# Patient Record
Sex: Female | Born: 1965 | Race: Black or African American | Hispanic: No | Smoking: Former smoker
Health system: Southern US, Community
[De-identification: ages and names within clinical notes are randomized; demographics above are authoritative.]

## PROBLEM LIST (undated history)

## (undated) DIAGNOSIS — I10 Essential (primary) hypertension: Secondary | ICD-10-CM

## (undated) DIAGNOSIS — E119 Type 2 diabetes mellitus without complications: Secondary | ICD-10-CM

## (undated) DIAGNOSIS — Z8719 Personal history of other diseases of the digestive system: Secondary | ICD-10-CM

## (undated) DIAGNOSIS — M7120 Synovial cyst of popliteal space [Baker], unspecified knee: Secondary | ICD-10-CM

## (undated) HISTORY — DX: Type 2 diabetes mellitus without complications: E11.9

## (undated) HISTORY — DX: Essential (primary) hypertension: I10

## (undated) HISTORY — PX: BREAST EXCISIONAL BIOPSY: SUR124

## (undated) HISTORY — PX: COLONOSCOPY: SHX174

## (undated) HISTORY — PX: BREAST BIOPSY: SHX20

---

## 1998-10-21 ENCOUNTER — Other Ambulatory Visit: Admission: RE | Admit: 1998-10-21 | Discharge: 1998-10-21 | Payer: Self-pay | Admitting: *Deleted

## 1999-09-10 ENCOUNTER — Other Ambulatory Visit: Admission: RE | Admit: 1999-09-10 | Discharge: 1999-09-10 | Payer: Self-pay | Admitting: Internal Medicine

## 1999-09-18 ENCOUNTER — Encounter: Payer: Self-pay | Admitting: Family Medicine

## 1999-09-18 ENCOUNTER — Encounter: Admission: RE | Admit: 1999-09-18 | Discharge: 1999-09-18 | Payer: Self-pay | Admitting: Family Medicine

## 2000-12-01 ENCOUNTER — Other Ambulatory Visit: Admission: RE | Admit: 2000-12-01 | Discharge: 2000-12-01 | Payer: Self-pay | Admitting: Internal Medicine

## 2001-01-19 ENCOUNTER — Other Ambulatory Visit: Admission: RE | Admit: 2001-01-19 | Discharge: 2001-01-19 | Payer: Self-pay | Admitting: Internal Medicine

## 2001-11-25 ENCOUNTER — Encounter: Payer: Self-pay | Admitting: Obstetrics and Gynecology

## 2001-11-25 ENCOUNTER — Ambulatory Visit (HOSPITAL_COMMUNITY): Admission: AD | Admit: 2001-11-25 | Discharge: 2001-11-25 | Payer: Self-pay | Admitting: Obstetrics and Gynecology

## 2001-11-25 ENCOUNTER — Encounter (INDEPENDENT_AMBULATORY_CARE_PROVIDER_SITE_OTHER): Payer: Self-pay | Admitting: *Deleted

## 2005-03-07 ENCOUNTER — Emergency Department (HOSPITAL_COMMUNITY): Admission: EM | Admit: 2005-03-07 | Discharge: 2005-03-08 | Payer: Self-pay | Admitting: Emergency Medicine

## 2006-07-17 ENCOUNTER — Emergency Department (HOSPITAL_COMMUNITY): Admission: EM | Admit: 2006-07-17 | Discharge: 2006-07-17 | Payer: Self-pay | Admitting: Emergency Medicine

## 2006-08-11 ENCOUNTER — Other Ambulatory Visit: Admission: RE | Admit: 2006-08-11 | Discharge: 2006-08-11 | Payer: Self-pay | Admitting: Obstetrics and Gynecology

## 2006-10-20 ENCOUNTER — Ambulatory Visit (HOSPITAL_COMMUNITY): Admission: RE | Admit: 2006-10-20 | Discharge: 2006-10-20 | Payer: Self-pay | Admitting: Obstetrics and Gynecology

## 2007-01-12 ENCOUNTER — Ambulatory Visit (HOSPITAL_COMMUNITY): Admission: RE | Admit: 2007-01-12 | Discharge: 2007-01-12 | Payer: Self-pay | Admitting: Obstetrics and Gynecology

## 2007-01-12 ENCOUNTER — Encounter (INDEPENDENT_AMBULATORY_CARE_PROVIDER_SITE_OTHER): Payer: Self-pay | Admitting: Specialist

## 2007-04-05 ENCOUNTER — Encounter: Admission: RE | Admit: 2007-04-05 | Discharge: 2007-04-05 | Payer: Self-pay | Admitting: Obstetrics and Gynecology

## 2007-04-09 ENCOUNTER — Encounter: Admission: RE | Admit: 2007-04-09 | Discharge: 2007-04-09 | Payer: Self-pay | Admitting: Interventional Radiology

## 2007-05-30 ENCOUNTER — Ambulatory Visit (HOSPITAL_COMMUNITY): Admission: RE | Admit: 2007-05-30 | Discharge: 2007-05-30 | Payer: Self-pay | Admitting: Interventional Radiology

## 2007-06-01 ENCOUNTER — Ambulatory Visit (HOSPITAL_COMMUNITY): Admission: RE | Admit: 2007-06-01 | Discharge: 2007-06-02 | Payer: Self-pay | Admitting: Interventional Radiology

## 2007-06-21 ENCOUNTER — Encounter: Admission: RE | Admit: 2007-06-21 | Discharge: 2007-06-21 | Payer: Self-pay | Admitting: Interventional Radiology

## 2007-10-09 ENCOUNTER — Other Ambulatory Visit: Admission: RE | Admit: 2007-10-09 | Discharge: 2007-10-09 | Payer: Self-pay | Admitting: Obstetrics and Gynecology

## 2007-10-23 ENCOUNTER — Ambulatory Visit (HOSPITAL_COMMUNITY): Admission: RE | Admit: 2007-10-23 | Discharge: 2007-10-23 | Payer: Self-pay | Admitting: Obstetrics and Gynecology

## 2007-10-27 ENCOUNTER — Encounter: Admission: RE | Admit: 2007-10-27 | Discharge: 2007-10-27 | Payer: Self-pay | Admitting: Obstetrics and Gynecology

## 2007-12-12 ENCOUNTER — Encounter: Admission: RE | Admit: 2007-12-12 | Discharge: 2007-12-12 | Payer: Self-pay | Admitting: Surgery

## 2007-12-14 ENCOUNTER — Ambulatory Visit (HOSPITAL_BASED_OUTPATIENT_CLINIC_OR_DEPARTMENT_OTHER): Admission: RE | Admit: 2007-12-14 | Discharge: 2007-12-14 | Payer: Self-pay | Admitting: Surgery

## 2007-12-14 ENCOUNTER — Encounter (INDEPENDENT_AMBULATORY_CARE_PROVIDER_SITE_OTHER): Payer: Self-pay | Admitting: Surgery

## 2007-12-14 ENCOUNTER — Encounter: Admission: RE | Admit: 2007-12-14 | Discharge: 2007-12-14 | Payer: Self-pay | Admitting: Surgery

## 2007-12-20 ENCOUNTER — Encounter: Admission: RE | Admit: 2007-12-20 | Discharge: 2007-12-20 | Payer: Self-pay | Admitting: Interventional Radiology
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
## 2011-03-30 NOTE — Op Note (Signed)
Lindsey Beasley, Lindsey Beasley            ACCOUNT NO.:  [id number]   MEDICAL RECORD NO.:  [id number]          PATIENT TYPE:  AMB   LOCATION:  DSC                          FACILITY:  [facility]   PHYSICIAN:  Thomas A. Cornett, M.D.DATE OF BIRTH:  Aug 21, 1966   DATE OF PROCEDURE:  12/14/2007  DATE OF DISCHARGE:                               OPERATIVE REPORT   PREOPERATIVE DIAGNOSIS:  Left breast mass.   POSTOPERATIVE DIAGNOSIS:  Left breast mass.   PROCEDURE:  Left breast needle localized excisional biopsy.   SURGEON:  Maisie Fus A. Cornett, M.D.   ANESTHESIA:  MAC with 0.25% Sensorcaine.   DRAINS:  None.   ESTIMATED BLOOD LOSS:  10 mL.   SPECIMEN:  Left breast tissue with localizing wire and previously placed  core biopsy clip found to be present in radiographic specimen.   INDICATIONS FOR PROCEDURE:  The patient is a 45 year old female with a  left breast mass.  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  She presents today for definitive excision.   DESCRIPTION OF PROCEDURE:  After undergoing left breast needle  localization, the patient was brought back to the operating room and  placed supine.  After induction of MAC anesthesia, the left breast was  prepped and draped in a sterile fashion.  The wire exited the medial  left breast.  An incision was made after infiltration of local  anesthesia.  The tissue around the tip of the wire was excised in its  entirety.  This was placed in the Faxitron and the image revealed the  tip of the wire to be present and the clip to be present.  This was sent  to pathology.  The cavity was irrigated out and closed in layers with a  3-0 Vicryl and a subsequent 4-0 Monocryl in a subcuticular fashion.  Dermabond was applied.  All final counts of sponge, needle and  instruments were found to be correct this portion of the case.  The  patient was awakened and  taken to recovery in satisfactory condition.      Thomas A. Cornett, M.D.  Electronically Signed    TAC/MEDQ  D:  12/14/2007  T:  12/14/2007  Job:  [id number]   cc:   Dr. Roswell Nickel

## 2011-04-02 NOTE — H&P (Signed)
Lindsey Beasley, COMUNALE            ACCOUNT NO.:  [id number]   MEDICAL RECORD NO.:  [id number]          PATIENT TYPE:  AMB   LOCATION:  SDC                           FACILITY:  [facility]   PHYSICIAN:  Gerald Leitz, MD          DATE OF BIRTH:  [birth year]   DATE OF ADMISSION:  01/12/2007  DATE OF DISCHARGE:                              HISTORY & PHYSICAL   CHIEF COMPLAINT:  Menorrhagia.   HISTORY OF PRESENT ILLNESS:  This is a 45 year old, G4, P2-0-2-2, with  history of menorrhagia and uterine fibroids.  She had an ultrasound that  showed multiple uterine fibroids, largest measuring 6.8 x 6.6 x 6.3 cm  that was posterior and submucosal  The uterus measured 12.2 cm in  length, and she has a history of anemia.  Hemoglobin in October 2007 was  8.0.  The patient is not interested in hysterectomy and desires  treatment with endometrial ablation and D&C.   PAST MEDICAL HISTORY:  Hypertension.   PAST OB HISTORY:  1. C section x2.  2. Elective abortion x1.  3. Miscarriage x1.   GYN HISTORY:  No history of sexually transmitted diseases, no history of  abnormal Pap smears.  Last Pap smear was September 2007, and this was  normal.   MEDICATIONS:  Iron sulfate and two blood pressure medications.  The  patient is unsure of these medications and will bring to her preop visit  at the hospital.   ALLERGIES:  No known drug allergies.   SOCIAL HISTORY:  The patient is an Production designer, theatre/television/film, works in [employer].  She smokes approximately 5 cigarettes a day.  Reports  alcohol with beer and wine on occasion.  No illicit drug use.   FAMILY HISTORY:  No immediate family history of breast, ovarian, colon  cancer.   REVIEW OF SYSTEMS:  Negative except as stated in History of Present  Illness.   PHYSICAL EXAMINATION:  VITAL SIGNS: Blood pressure 128/92, heart rate  86.  Weight 240 pounds.  CARDIOVASCULAR:  Regular rate and rhythm.  LUNGS: Clear to auscultation bilaterally.  ABDOMEN: Soft, nontender,  nondistended.  Positive bowel sounds.  EXTREMITIES:  No clubbing, cyanosis, or edema.  PELVIC:  Normal external female genitalia.  No vulvar, vaginal, cervical  lesions noted.  Bimanual exam revealed approximately 12-14 week size  uterus.  No adnexal masses or tenderness.   IMPRESSION AND PLAN:  Menorrhagia, most likely secondary to uterine  fibroids.  The patient declined definitive therapy with hysterectomy and  desires to proceed with D&C and endometrial ablation.  Risks, benefits,  alternatives of the surgery were discussed with the patient including  infection, bleeding, uterine perforation, need for possible further  surgery, transfusion, HIV, hepatitis B and C.  She understands all risks  and desires to proceed with dilation and curettage and endometrial  ablation.      Gerald Leitz, MD  Electronically Signed     TC/MEDQ  D:  01/10/2007  T:  01/10/2007  Job:  [id number]   cc:   Preop Holding Area

## 2011-04-02 NOTE — Op Note (Signed)
[facility]  Patient:    Lindsey Beasley, Lindsey Beasley Visit Number: [id number] MRN: [id number]          Service Type: DSU Location: [facility] Attending Physician:  Leonard Schwartz Dictated by:   Janine Limbo, M.D. Proc. Date: 11/25/01 Admit Date:  11/25/2001                             Operative Report  PREOPERATIVE DIAGNOSES:       1. First trimester incomplete abortion.                               2. Anemia (hemoglobin 11.9).  POSTOPERATIVE DIAGNOSES:      1. First trimester incomplete abortion.                               2. Anemia (hemoglobin 11.9).  PROCEDURE:                    Suction dilatation and evacuation.  SURGEON:                      Janine Limbo, M.D.  ANESTHESIA:                   Monitored anesthetic control with paracervical block using 1% lidocaine.  INDICATIONS:                  The patient is a 45 year old female, gravida 3, para 2-0-0-2, who presents with a first trimester incomplete abortion.  The patient presented to the emergency department at [facility] complaining of bleeding and cramping.  I was the on-call physician and, therefore, I was responsible for caring for the patient.  She was noted to have an elevated quantitative hCG and she was also noted to have tissue at the cervical os.  A dilatation and curettage was recommended and accepted.  The patient understands the indications for her procedure and she accepts the risks of (but not limited to) anesthetic complications, bleeding, infection and possible damage to the surrounding organs.  FINDINGS:                     The patients blood type is A positive.  A large amount of products of conception were removed from the patients cervix and uterine cavity.  The uterus sounded to 11 cm.  The preoperative uterus was approximately 12 weeks in size and the postoperative uterus was approximately 10 weeks in size.  DESCRIPTION OF PROCEDURE:     The  patient was taken to the operating room, where she was given medication through her IV line.  The perineum and vagina were prepped with multiple layers of Betadine.  The bladder was drained of urine.  Examination under anesthesia was performed.  The patient was sterilely draped.  A paracervical block was placed using 10 cc of 1% Xylocaine.  Tissue was removed from the vagina and the cervix.  The uterus was sounded to 11 cm. The uterine cavity was curetted using a size #8 suction curet and a medium sharp curet.  The cavity was felt to be clean at the end of the procedure. Sponge, needle and instrument counts were correct on two occasions.  Estimated blood loss was 30 cc, and that included the blood  clots in the vagina.  The patient was returned to the supine position and taken to the recovery room in stable condition at the end of her procedure.  FOLLOW-UP INSTRUCTIONS:       The patient was given a prescription for Tylenol #3.  She will take 1-2 tablets every four hours as needed for pain.  SHe will call for questions or concerns.  She was given a copy of the postoperative instruction sheet as prepared by the [facility] for patients who have undergone a dilatation and curettage. Dictated by:   Janine Limbo, M.D. Attending Physician:  Leonard Schwartz DD:  11/25/01 TD:  11/26/01 Job: [id number]

## 2011-04-02 NOTE — Op Note (Signed)
NAMEAQUILA, Lindsey Beasley            ACCOUNT NO.:  [id number]   MEDICAL RECORD NO.:  [id number]          PATIENT TYPE:  AMB   LOCATION:  SDC                           FACILITY:  [facility]   PHYSICIAN:  Gerald Leitz, MD          DATE OF BIRTH:  [birth year]   DATE OF PROCEDURE:  01/12/2007  DATE OF DISCHARGE:                               OPERATIVE REPORT   PREOPERATIVE DIAGNOSES:  1. Menorrhagia.  2. Uterine fibroids.   POSTOPERATIVE DIAGNOSES:  1. Menorrhagia.  2. Uterine fibroids.   PROCEDURES:  1. Dilation and curettage.  2. Attempted ThermaChoice endometrial ablation.   SURGEON:  Gerald Leitz, MD   ASSISTANT:  None.   ANESTHESIA:  General.   SPECIMEN:  Endometrial curettings sent to pathology.   ESTIMATED BLOOD LOSS:  Minimal.   FLUIDS:  Per anesthesia.   URINE OUTPUT:  150 mL of clear urine at the beginning of the procedure.   COMPLICATIONS:  None.   FINDINGS:  A 14-week sized uterus by bimanual exam.  The uterus sounded  to 10.5.   PROCEDURE:  The patient was taken to the operating room, where she was  placed in the dorsal lithotomy position.  She was prepped and draped in  the usual sterile fashion.  A bivalve speculum was placed into the  vaginal vault.  The anterior lip of the cervix was grasped with a single-  toothed tenaculum.  The uterus was then sounded to 10.5 cm.  Dilation  was performed of the cervix up to #[facility] dilator.  Sharp curettage  was performed until a gritty texture was noted all the way round.  The  ThermaChoice ablation apparatus was primed to -150 mmHg and the  ThermaChoice apparatus was inserted through the cervix to a depth of 10  cm.  It was filled with D5W 35 mL.  The pressure was not maintained to  150 mmHg and continued to fall.  A decision was made to abort attempt at  performing ThermaChoice ablation due to the large cavity size of the  uterus.  The single-tooth tenaculum was removed from the anterior lip of  the cervix.  The bivalve  speculum was removed.  Excellent hemostasis was  noted  prior to removal of the bivalve speculum.  The patient was awakened from  anesthesia and taken to the recovery room awake and in stable condition.   SPECIMEN:  Endometrial curettings.  Disposition:  Sent to pathology.      Gerald Leitz, MD  Electronically Signed     TC/MEDQ  D:  01/12/2007  T:  01/12/2007  Job:  [id number]

## 2011-06-08 ENCOUNTER — Other Ambulatory Visit: Payer: Self-pay | Admitting: Obstetrics and Gynecology

## 2011-06-08 DIAGNOSIS — Z1231 Encounter for screening mammogram for malignant neoplasm of breast: Secondary | ICD-10-CM

## 2011-06-17 ENCOUNTER — Ambulatory Visit
Admission: RE | Admit: 2011-06-17 | Discharge: 2011-06-17 | Disposition: A | Discharge status: Home / Self Care | Payer: Federal, State, Local not specified - PPO | Source: Ambulatory Visit | Attending: Obstetrics and Gynecology | Admitting: Obstetrics and Gynecology

## 2011-06-17 DIAGNOSIS — Z1231 Encounter for screening mammogram for malignant neoplasm of breast: Secondary | ICD-10-CM

## 2011-06-21 ENCOUNTER — Other Ambulatory Visit: Payer: Self-pay | Admitting: Obstetrics and Gynecology

## 2011-06-21 DIAGNOSIS — R928 Other abnormal and inconclusive findings on diagnostic imaging of breast: Secondary | ICD-10-CM

## 2011-06-25 ENCOUNTER — Ambulatory Visit
Admission: RE | Admit: 2011-06-25 | Discharge: 2011-06-25 | Disposition: A | Discharge status: Home / Self Care | Payer: Federal, State, Local not specified - PPO | Source: Ambulatory Visit | Attending: Obstetrics and Gynecology | Admitting: Obstetrics and Gynecology

## 2011-06-25 DIAGNOSIS — R928 Other abnormal and inconclusive findings on diagnostic imaging of breast: Secondary | ICD-10-CM

## 2011-08-05 LAB — POCT HEMOGLOBIN-HEMACUE: Hemoglobin: 12.5

## 2011-08-05 LAB — BASIC METABOLIC PANEL
BUN: 4 — ABNORMAL LOW
CO2: 24
Calcium: 8.7
Creatinine, Ser: 0.65
GFR calc non Af Amer: >60
Glucose, Bld: 107 — ABNORMAL HIGH

## 2011-08-05 LAB — DIFFERENTIAL
Basophils Absolute: 0.1
Basophils Relative: 1
Lymphocytes Relative: 40
Monocytes Absolute: 0.5
Neutro Abs: 3.8
Neutrophils Relative %: 46

## 2011-08-05 LAB — CBC
MCHC: 32.6
Platelets: 426 — ABNORMAL HIGH
RDW: 16.9 — ABNORMAL HIGH

## 2011-08-13 ENCOUNTER — Other Ambulatory Visit (HOSPITAL_COMMUNITY)
Admission: RE | Admit: 2011-08-13 | Discharge: 2011-08-13 | Disposition: A | Discharge status: Home / Self Care | Payer: Federal, State, Local not specified - PPO | Source: Ambulatory Visit | Attending: Obstetrics and Gynecology | Admitting: Obstetrics and Gynecology

## 2011-08-13 ENCOUNTER — Other Ambulatory Visit: Payer: Self-pay | Admitting: Obstetrics and Gynecology

## 2011-08-13 DIAGNOSIS — Z01419 Encounter for gynecological examination (general) (routine) without abnormal findings: Secondary | ICD-10-CM | POA: Insufficient documentation

## 2011-08-30 LAB — CREATININE, SERUM
Creatinine, Ser: 0.59
GFR calc Af Amer: >60
GFR calc non Af Amer: >60

## 2011-08-30 LAB — CBC
MCHC: 32.7
RBC: 3.94
WBC: 6.9

## 2011-12-08 ENCOUNTER — Other Ambulatory Visit: Payer: Self-pay | Admitting: Pharmacist

## 2011-12-08 ENCOUNTER — Other Ambulatory Visit: Payer: Self-pay | Admitting: Obstetrics and Gynecology

## 2011-12-08 DIAGNOSIS — N6009 Solitary cyst of unspecified breast: Secondary | ICD-10-CM

## 2012-01-03 ENCOUNTER — Ambulatory Visit
Admission: RE | Admit: 2012-01-03 | Discharge: 2012-01-03 | Disposition: A | Discharge status: Home / Self Care | Payer: Federal, State, Local not specified - PPO | Source: Ambulatory Visit | Attending: Obstetrics and Gynecology | Admitting: Obstetrics and Gynecology

## 2012-01-03 DIAGNOSIS — N6009 Solitary cyst of unspecified breast: Secondary | ICD-10-CM

## 2012-03-11 ENCOUNTER — Other Ambulatory Visit (HOSPITAL_COMMUNITY): Payer: Self-pay | Admitting: Pharmacy Technician

## 2012-03-11 ENCOUNTER — Emergency Department (HOSPITAL_COMMUNITY)
Admission: EM | Admit: 2012-03-11 | Discharge: 2012-03-11 | Disposition: A | Discharge status: Home / Self Care | Payer: Federal, State, Local not specified - PPO | Attending: Emergency Medicine | Admitting: Emergency Medicine

## 2012-03-11 ENCOUNTER — Emergency Department (HOSPITAL_COMMUNITY): Payer: Federal, State, Local not specified - PPO

## 2012-03-11 DIAGNOSIS — S42253A Displaced fracture of greater tuberosity of unspecified humerus, initial encounter for closed fracture: Secondary | ICD-10-CM | POA: Insufficient documentation

## 2012-03-11 DIAGNOSIS — W010XXA Fall on same level from slipping, tripping and stumbling without subsequent striking against object, initial encounter: Secondary | ICD-10-CM | POA: Insufficient documentation

## 2012-03-11 MED ORDER — ACETAMINOPHEN 325 MG PO TABS
650.0000 mg | ORAL_TABLET | Freq: Once | ORAL | Status: AC
  Administered 2012-03-11: 650 mg via ORAL
  Filled 2012-03-11: qty 2

## 2012-03-11 MED ORDER — HYDROCODONE-ACETAMINOPHEN 5-325 MG PO TABS: 1.0000 | ORAL_TABLET | ORAL | Status: AC | PRN

## 2012-03-11 NOTE — Discharge Instructions (Signed)
Take vicodin as prescribed for severe pain.   Do not drive within four hours of taking this medication (may cause drowsiness or confusion).  Keep your shoulder immobilized in sling until you have followed up with the orthopedic doctor.  Apply ice to painful areas 2-3 times a day for 15-20 minutes.  You may return to the ER if symptoms worsen or you have any other concerns.

## 2012-03-11 NOTE — ED Notes (Signed)
Pt was reaching up to put something on a shelf and jumped and lost her balance when she landed and fell on her left side. Pt now with left shoulder pain.  No swelling or deformity noted.

## 2012-03-11 NOTE — ED Provider Notes (Signed)
History     CSN: [id number]  Arrival date & time 03/11/12  1755   First MD Initiated Contact with Patient 03/11/12 1900      Chief Complaint  Patient presents with  . Fall  . Shoulder Pain    (Consider location/radiation/quality/duration/timing/severity/associated sxs/prior treatment) HPI History provided by pt.   Pt was reaching up to place something on a shelf in her garage, lost her footing, and fell landing on her left shoulder first, and then hitting the rest of her left side.  Injury occurred just PTA.  Denies having lightheadedness, dizziness, CP/SOB/palpitations prior to fall.  Unsure of whether or not she hit her head, but no LOC and denies headache, dizziness, blurred vision, N/V.  C/o severe, constant pain left shoulder that is aggravated by ROM, and milder pain left elbow and entire LLE.  Has not taken anything for pain.  No associated paresthesias.  She is able to ambulate w/out difficulty.  Denies neck/back pain.    No past medical history on file.  No past surgical history on file.  No family history on file.  History  Substance Use Topics  . Smoking status: Not on file  . Smokeless tobacco: Not on file  . Alcohol Use: Not on file    OB History    No data available      Review of Systems  All other systems reviewed and are negative.    Allergies  Review of patient's allergies indicates no known allergies.  Home Medications   Current Outpatient Rx  Name Route Sig Dispense Refill  . ACETAMINOPHEN 500 MG PO TABS Oral Take 500 mg by mouth every 6 (six) hours as needed. For pain relief    . AMLODIPINE BESYLATE 10 MG PO TABS Oral Take 10 mg by mouth daily.    Marland Kitchen LOSARTAN POTASSIUM-HCTZ 100-25 MG PO TABS Oral Take 1 tablet by mouth daily.    . NEBIVOLOL HCL 2.5 MG PO TABS Oral Take 2.5 mg by mouth daily.      BP 139/86  Pulse 80  Temp(Src) 98.9 F (37.2 C) (Oral)  Resp 20  SpO2 99%  Physical Exam  Nursing note and vitals reviewed. Constitutional:  She is oriented to person, place, and time. She appears well-developed and well-nourished. No distress.  HENT:  Head: Normocephalic and atraumatic.  Eyes:       Normal appearance  Neck: Normal range of motion.  Pulmonary/Chest: Effort normal.  Musculoskeletal: Normal range of motion.       Entire spine non-tender.  No extremity deformities.  Mild erythema anterior and top of left shoulder.  Tenderness at left [facility] joint, proximal humerus and less so at scapula.  Pain w/ passive abduction/flexion greater than 90deg.  No tenderness upper arm or forearm.  Mild tenderness olecranon.  Passive ROM of elbow induces pain in shoulder.  Full ROM of wrist but supination induces pain at shoulder.  2+ radial pulse and distal sensation intact.  Pelvis stable.  Left hip, knee and ankle w/ full, non-painful ROM.  2+ DP pulse and distal sensation intact.   Neurological: She is alert and oriented to person, place, and time.       CN 3-12 intact.  5/5 and equal upper and lower extremity strength.  No past pointing.     Psychiatric: She has a normal mood and affect. Her behavior is normal.    ED Course  Procedures (including critical care time)  Labs Reviewed - No data to display Dg Elbow  Complete Left  03/11/2012  *RADIOLOGY REPORT*  Clinical Data: Fall  LEFT ELBOW - COMPLETE 3+ VIEW  Comparison: None.  Findings: No acute fracture and no dislocation.  Unremarkable soft tissues.  IMPRESSION: No acute bony pathology.  Original Report Authenticated By: Donavan Burnet, M.D.   Dg Shoulder Left  03/11/2012  *RADIOLOGY REPORT*  Clinical Data: Fall  LEFT SHOULDER - 2+ VIEW  Comparison: None.  Findings: There is a minimally displaced a avulsion fracture involving the greater tuberosity of the proximal humerus.  The humeral neck is intact.  Glenoid and clavicle are intact.  Acromion is intact.  IMPRESSION: Minimally displaced small avulsion fracture from the greater tuberosity of the proximal humerus.  Original Report  Authenticated By: Donavan Burnet, M.D.     1. Fracture of humerus greater tuberosity       MDM  Pt had a mechanical fall this evening and fell, landing on her left side.  Unsure of whether or not she hit her head but is not anti-coagulated, no signs of head trauma, no neuro complaints nor focal neuro deficits on exam.  C/o pain in left shoulder and elbow.  Xrays sig for sm avulsion fx from greater tuberosity of proximal humerus.  Results discussed w/ pt.  Ortho tech provided w/ shoulder sling and pt d/c'd home w/ vicodin and referral to ortho.        Arie Sabina [place], [place] 03/12/12 [id number]

## 2012-03-11 NOTE — ED Notes (Signed)
Pt is awaiting sling/immobilizer placement on left arm

## 2012-03-18 NOTE — ED Provider Notes (Signed)
Medical screening examination/treatment/procedure(s) were performed by non-physician practitioner and as supervising physician I was immediately available for consultation/collaboration.  Cherese Lozano, MD 03/18/12 0818 

## 2012-06-28 ENCOUNTER — Other Ambulatory Visit: Payer: Self-pay | Admitting: Obstetrics and Gynecology

## 2012-06-28 DIAGNOSIS — Z1231 Encounter for screening mammogram for malignant neoplasm of breast: Secondary | ICD-10-CM

## 2012-07-14 ENCOUNTER — Ambulatory Visit
Admission: RE | Admit: 2012-07-14 | Discharge: 2012-07-14 | Disposition: A | Discharge status: Home / Self Care | Payer: Federal, State, Local not specified - PPO | Source: Ambulatory Visit | Attending: Obstetrics and Gynecology | Admitting: Obstetrics and Gynecology

## 2012-07-14 DIAGNOSIS — Z1231 Encounter for screening mammogram for malignant neoplasm of breast: Secondary | ICD-10-CM

## 2012-08-17 ENCOUNTER — Other Ambulatory Visit: Payer: Self-pay | Admitting: Obstetrics and Gynecology

## 2012-08-17 ENCOUNTER — Other Ambulatory Visit (HOSPITAL_COMMUNITY)
Admission: RE | Admit: 2012-08-17 | Discharge: 2012-08-17 | Disposition: A | Discharge status: Home / Self Care | Payer: Federal, State, Local not specified - PPO | Source: Ambulatory Visit | Attending: Obstetrics and Gynecology | Admitting: Obstetrics and Gynecology

## 2012-08-17 DIAGNOSIS — Z01419 Encounter for gynecological examination (general) (routine) without abnormal findings: Secondary | ICD-10-CM | POA: Insufficient documentation

## 2013-07-02 ENCOUNTER — Other Ambulatory Visit: Payer: Self-pay

## 2013-07-02 DIAGNOSIS — Z1231 Encounter for screening mammogram for malignant neoplasm of breast: Secondary | ICD-10-CM

## 2013-07-18 ENCOUNTER — Ambulatory Visit
Admission: RE | Admit: 2013-07-18 | Discharge: 2013-07-18 | Disposition: A | Discharge status: Home / Self Care | Payer: Federal, State, Local not specified - PPO | Source: Ambulatory Visit

## 2013-07-18 DIAGNOSIS — Z1231 Encounter for screening mammogram for malignant neoplasm of breast: Secondary | ICD-10-CM

## 2013-09-05 ENCOUNTER — Other Ambulatory Visit (HOSPITAL_COMMUNITY)
Admission: RE | Admit: 2013-09-05 | Discharge: 2013-09-05 | Disposition: A | Discharge status: Home / Self Care | Payer: Federal, State, Local not specified - PPO | Source: Ambulatory Visit | Attending: Obstetrics and Gynecology | Admitting: Obstetrics and Gynecology

## 2013-09-05 ENCOUNTER — Other Ambulatory Visit: Payer: Self-pay | Admitting: Obstetrics and Gynecology

## 2013-09-05 DIAGNOSIS — Z1151 Encounter for screening for human papillomavirus (HPV): Secondary | ICD-10-CM | POA: Insufficient documentation

## 2013-09-05 DIAGNOSIS — Z01419 Encounter for gynecological examination (general) (routine) without abnormal findings: Secondary | ICD-10-CM | POA: Insufficient documentation

## 2013-09-05 DIAGNOSIS — Z113 Encounter for screening for infections with a predominantly sexual mode of transmission: Secondary | ICD-10-CM | POA: Insufficient documentation

## 2013-09-17 ENCOUNTER — Other Ambulatory Visit: Payer: Self-pay | Admitting: Gastroenterology

## 2014-01-18 ENCOUNTER — Ambulatory Visit: Payer: Self-pay | Admitting: Podiatrist

## 2014-01-23 ENCOUNTER — Encounter: Payer: Self-pay | Admitting: Podiatrist

## 2014-01-23 ENCOUNTER — Ambulatory Visit (INDEPENDENT_AMBULATORY_CARE_PROVIDER_SITE_OTHER): Payer: Federal, State, Local not specified - PPO | Admitting: Podiatrist

## 2014-01-23 ENCOUNTER — Ambulatory Visit (INDEPENDENT_AMBULATORY_CARE_PROVIDER_SITE_OTHER): Payer: Federal, State, Local not specified - PPO

## 2014-01-23 VITALS — BP 149/83 | HR 80 | Resp 16 | Ht 66.0 in | Wt 200.0 lb

## 2014-01-23 DIAGNOSIS — M898X9 Other specified disorders of bone, unspecified site: Secondary | ICD-10-CM

## 2014-01-23 NOTE — Progress Notes (Deleted)
Curly 5th toes, small baby bunion left-- Talked surgery-- has done all conservative

## 2014-01-23 NOTE — Progress Notes (Signed)
   Subjective:    Patient ID: Lindsey Beasley, female    DOB: 06-Jul-1966, 48 y.o.   MRN: [id number]  HPI Comments: "I have these corns"  Patient c/o painful 5th toes bilateral, left worse than right, for several months. The areas are callused and red, especially the left. Her shoes aggravate. She keeps areas padded with shoe gear.      Review of Systems  HENT: Positive for sinus pressure.   Respiratory: Positive for cough.   Allergic/Immunologic: Positive for environmental allergies.  All other systems reviewed and are negative.       Objective:   Physical Exam  GENERAL APPEARANCE: Alert, conversant. Appropriately groomed. No acute distress.  VASCULAR: Pedal pulses palpable at 2/4 DP and PT bilateral.  Capillary refill time is immediate to all digits,  Proximal to distal cooling it warm to warm.  Digital hair growth is present bilateral  NEUROLOGIC: sensation is intact epicritically and protectively to 5.07 monofilament at 5/5 sites bilateral.  Light touch is intact bilateral, vibratory sensation intact bilateral, achilles tendon reflex is intact bilateral.  MUSCULOSKELETAL: acceptable muscle strength, tone and stability bilateral. Adductovarus deformity bilateral fifth digits is present. Hyperkeratotic lesions are present at the proximal interphalangeal joint bilaterally. A small bunion deformity is also present on the left. DERMATOLOGIC: Hyperkeratotic lesion/corns are present at the head of the proximal phalanx on the fifth digits bilaterally. Otherwise the remainder of the skin color, texture, and turger are within normal limits.      Assessment & Plan:  Adductovarus deformity fifth digits bilaterally, associated corns bilaterally  Plan: Discussed conservative treatment options including shoe gear changes, padding, strapping and the patient feels she's exhausted all conservative care. We also discussed surgery which would include derotational arthroplasty with excision of  hypertrophic bone fifth digits bilaterally. Possible shave of the bunion left will be addressed if this is bothersome as well. If she is interested in surgery she will call and let me know we will schedule a preoperative consult.

## 2014-01-23 NOTE — Patient Instructions (Signed)
Curled Fifth Toe A curled fifth toe is an inherited condition in which your little toe curls under the toe next to it and the toenail is pointing outward. In this condition, your little toe bears weight on its side with resulting discomfort and cornsor calluses (a buildup of dead skin cells over bones that come in frequent contact with hard surfaces). These problems, if painful or troublesome, can be corrected with surgery. This is an elective surgery, (you can decide whether or not you should undergo the procedure). The surgery may be cosmetic (improve appearance), relieve pain or improve function. If surgery is recommended, your caregiver will explain your foot problem and how surgery can improve it. Your caregiver can answer your questions about the potential risks and complications of surgery. After determining that foot surgery is the best way to correct the problem, plans for the surgery can proceed. Let the caregiver know about health changes prior to surgery. It is best to do elective surgeries when your health is optimal. Be sure to ask your caregiver if there will be limits on walking, work or school. Plan accordingly. You can bear weight immediately but may need to wear a surgical shoe for a few weeks as the toe heals. The treatment of this condition is surgical and is called a derotation arthroplasty. A wedge of skin and bone is removed to uncurl ("de-rotate") the toe and have it point forward like a normal toe. LET YOUR CAREGIVER KNOW ABOUT:   Allergies to food or medicine.  Medicines taken, including vitamins, herbs, eye drops, over-the-counter medicines and creams.  Use of steroids (by mouth or creams).  Previous problems with anesthetics or numbing medicines.  A history of bleeding problems or blood clots.  Any previous surgery.  Other important health problems, including diabetes and kidney problems.  The possibility of pregnancy, if this applies. BEFORE THE PROCEDURE You  should be present 60 minutes prior to your procedure or as directed. HOME CARE INSTRUCTIONS   You can bear weight immediately, but will need to wear a bandage, a splint and sometimes, a surgical shoe for a short time following surgery.  Only take over-the-counter or prescription medicines for pain, discomfort or fever as directed by your caregiver.  Elevate your foot above the level of the heart as much as possible to limit swelling. SEEK MEDICAL CARE IF:   You have increased bleeding (more than a small spot) from the wound.  You have redness, swelling, or increasing pain in the wound.  You have pus coming from the wound.  You notice a bad smell coming from the wound or dressing. SEEK IMMEDIATE MEDICAL CARE IF:   You develop a rash.  You have difficulty breathing.  You have any allergic problems.  You have a fever. Document Released: 10/29/2000 Document Revised: 01/24/2012 Document Reviewed: 08/27/2008 ExitCare Patient Information 2014 [employer].  

## 2014-07-03 ENCOUNTER — Other Ambulatory Visit: Payer: Self-pay

## 2014-07-03 DIAGNOSIS — Z1231 Encounter for screening mammogram for malignant neoplasm of breast: Secondary | ICD-10-CM

## 2014-07-19 ENCOUNTER — Ambulatory Visit
Admission: RE | Admit: 2014-07-19 | Discharge: 2014-07-19 | Disposition: A | Discharge status: Home / Self Care | Payer: Federal, State, Local not specified - PPO | Source: Ambulatory Visit

## 2014-07-19 ENCOUNTER — Ambulatory Visit: Payer: Federal, State, Local not specified - PPO

## 2014-07-19 DIAGNOSIS — Z1231 Encounter for screening mammogram for malignant neoplasm of breast: Secondary | ICD-10-CM

## 2014-10-03 ENCOUNTER — Other Ambulatory Visit: Payer: Self-pay | Admitting: Obstetrics and Gynecology

## 2014-10-03 ENCOUNTER — Other Ambulatory Visit (HOSPITAL_COMMUNITY)
Admission: RE | Admit: 2014-10-03 | Discharge: 2014-10-03 | Disposition: A | Discharge status: Home / Self Care | Payer: Federal, State, Local not specified - PPO | Source: Ambulatory Visit | Attending: Obstetrics and Gynecology | Admitting: Obstetrics and Gynecology

## 2014-10-03 DIAGNOSIS — Z01419 Encounter for gynecological examination (general) (routine) without abnormal findings: Secondary | ICD-10-CM | POA: Diagnosis present

## 2014-10-08 LAB — CYTOLOGY - PAP

## 2014-10-22 ENCOUNTER — Other Ambulatory Visit: Payer: Self-pay | Admitting: Obstetrics and Gynecology

## 2015-07-24 ENCOUNTER — Other Ambulatory Visit: Payer: Self-pay

## 2015-07-24 DIAGNOSIS — Z1231 Encounter for screening mammogram for malignant neoplasm of breast: Secondary | ICD-10-CM

## 2015-08-05 ENCOUNTER — Ambulatory Visit: Payer: Federal, State, Local not specified - PPO

## 2015-08-06 ENCOUNTER — Ambulatory Visit
Admission: RE | Admit: 2015-08-06 | Discharge: 2015-08-06 | Disposition: A | Discharge status: Home / Self Care | Payer: Federal, State, Local not specified - PPO | Source: Ambulatory Visit

## 2015-08-06 DIAGNOSIS — Z1231 Encounter for screening mammogram for malignant neoplasm of breast: Secondary | ICD-10-CM

## 2015-10-28 ENCOUNTER — Other Ambulatory Visit: Payer: Self-pay | Admitting: Obstetrics and Gynecology

## 2015-10-28 ENCOUNTER — Other Ambulatory Visit (HOSPITAL_COMMUNITY)
Admission: RE | Admit: 2015-10-28 | Discharge: 2015-10-28 | Disposition: A | Discharge status: Home / Self Care | Payer: Federal, State, Local not specified - PPO | Source: Ambulatory Visit | Attending: Obstetrics and Gynecology | Admitting: Obstetrics and Gynecology

## 2015-10-28 DIAGNOSIS — Z01419 Encounter for gynecological examination (general) (routine) without abnormal findings: Secondary | ICD-10-CM | POA: Insufficient documentation

## 2015-10-29 LAB — CYTOLOGY - PAP

## 2016-07-27 DIAGNOSIS — K080 Exfoliation of teeth due to systemic causes: Secondary | ICD-10-CM | POA: Diagnosis not present

## 2016-08-04 ENCOUNTER — Other Ambulatory Visit: Payer: Self-pay | Admitting: Obstetrics and Gynecology

## 2016-08-04 DIAGNOSIS — Z1231 Encounter for screening mammogram for malignant neoplasm of breast: Secondary | ICD-10-CM

## 2016-08-16 ENCOUNTER — Ambulatory Visit
Admission: RE | Admit: 2016-08-16 | Discharge: 2016-08-16 | Disposition: A | Discharge status: Home / Self Care | Payer: Federal, State, Local not specified - PPO | Source: Ambulatory Visit | Attending: Obstetrics and Gynecology | Admitting: Obstetrics and Gynecology

## 2016-08-16 DIAGNOSIS — Z1231 Encounter for screening mammogram for malignant neoplasm of breast: Secondary | ICD-10-CM

## 2016-10-28 ENCOUNTER — Other Ambulatory Visit: Payer: Self-pay | Admitting: Obstetrics and Gynecology

## 2016-10-28 ENCOUNTER — Other Ambulatory Visit (HOSPITAL_COMMUNITY)
Admission: RE | Admit: 2016-10-28 | Discharge: 2016-10-28 | Disposition: A | Discharge status: Home / Self Care | Payer: Federal, State, Local not specified - PPO | Source: Ambulatory Visit | Attending: Obstetrics and Gynecology | Admitting: Obstetrics and Gynecology

## 2016-10-28 DIAGNOSIS — Z01419 Encounter for gynecological examination (general) (routine) without abnormal findings: Secondary | ICD-10-CM | POA: Diagnosis not present

## 2016-10-28 DIAGNOSIS — Z1151 Encounter for screening for human papillomavirus (HPV): Secondary | ICD-10-CM | POA: Insufficient documentation

## 2016-10-29 ENCOUNTER — Other Ambulatory Visit: Payer: Self-pay | Admitting: Obstetrics and Gynecology

## 2016-10-29 DIAGNOSIS — N6310 Unspecified lump in the right breast, unspecified quadrant: Principal | ICD-10-CM

## 2016-10-29 DIAGNOSIS — N6315 Unspecified lump in the right breast, overlapping quadrants: Secondary | ICD-10-CM

## 2016-11-01 LAB — CYTOLOGY - PAP
DIAGNOSIS: NEGATIVE
HPV: NOT DETECTED

## 2016-11-03 ENCOUNTER — Ambulatory Visit
Admission: RE | Admit: 2016-11-03 | Discharge: 2016-11-03 | Disposition: A | Discharge status: Home / Self Care | Payer: Federal, State, Local not specified - PPO | Source: Ambulatory Visit | Attending: Obstetrics and Gynecology | Admitting: Obstetrics and Gynecology

## 2016-11-03 ENCOUNTER — Other Ambulatory Visit: Payer: Federal, State, Local not specified - PPO

## 2016-11-03 DIAGNOSIS — N6315 Unspecified lump in the right breast, overlapping quadrants: Secondary | ICD-10-CM

## 2016-11-03 DIAGNOSIS — N6310 Unspecified lump in the right breast, unspecified quadrant: Principal | ICD-10-CM

## 2016-11-03 DIAGNOSIS — R928 Other abnormal and inconclusive findings on diagnostic imaging of breast: Secondary | ICD-10-CM | POA: Diagnosis not present

## 2016-11-26 DIAGNOSIS — E785 Hyperlipidemia, unspecified: Secondary | ICD-10-CM | POA: Diagnosis not present

## 2016-11-26 DIAGNOSIS — Z0000 Encounter for general adult medical examination without abnormal findings: Secondary | ICD-10-CM | POA: Diagnosis not present

## 2016-11-26 DIAGNOSIS — R7303 Prediabetes: Secondary | ICD-10-CM | POA: Diagnosis not present

## 2016-11-26 DIAGNOSIS — I10 Essential (primary) hypertension: Secondary | ICD-10-CM | POA: Diagnosis not present

## 2016-12-28 ENCOUNTER — Encounter: Payer: Self-pay | Admitting: Dietician

## 2016-12-28 ENCOUNTER — Encounter: Payer: Federal, State, Local not specified - PPO | Attending: Family Medicine | Admitting: Dietician

## 2016-12-28 DIAGNOSIS — E119 Type 2 diabetes mellitus without complications: Secondary | ICD-10-CM | POA: Diagnosis not present

## 2016-12-28 DIAGNOSIS — Z713 Dietary counseling and surveillance: Secondary | ICD-10-CM | POA: Diagnosis not present

## 2016-12-28 NOTE — Progress Notes (Signed)
Patient was seen on 12/28/16 for the first of a series of three diabetes self-management courses at [facility].  Patient Education Plan per assessed needs and concerns is to attend four course education program for Diabetes Self Management Education.  The following learning objectives were met by the patient during this class:  Describe diabetes  State some common risk factors for diabetes  Defines the role of glucose and insulin  Identifies type of diabetes and pathophysiology  Describe the relationship between diabetes and cardiovascular risk  State the members of the Healthcare Team  States the rationale for glucose monitoring  State when to test glucose  State their individual Target Range  State the importance of logging glucose readings  Describe how to interpret glucose readings  Identifies A1C target  Explain the correlation between A1c and eAG values  State symptoms and treatment of high blood glucose  State symptoms and treatment of low blood glucose  Explain proper technique for glucose testing  Identifies proper sharps disposal  Handouts given during class include:  Living Well with Diabetes book  Carb Counting and Meal Planning book  Meal Plan Card  Carbohydrate guide  Meal planning worksheet  Low Sodium Flavoring Tips  The diabetes portion plate  Y4M to eAG Conversion Chart  Diabetes Medications  Diabetes Recommended Care Schedule  Support Group  Diabetes Success Plan  Core Class Satisfaction Survey  Follow-Up Plan:  Attend core 2

## 2017-01-04 ENCOUNTER — Ambulatory Visit: Payer: Federal, State, Local not specified - PPO

## 2017-01-11 ENCOUNTER — Ambulatory Visit: Payer: Federal, State, Local not specified - PPO

## 2017-01-13 ENCOUNTER — Encounter: Payer: Federal, State, Local not specified - PPO | Attending: Family Medicine | Admitting: Dietician

## 2017-01-13 DIAGNOSIS — Z713 Dietary counseling and surveillance: Secondary | ICD-10-CM | POA: Diagnosis not present

## 2017-01-13 DIAGNOSIS — E119 Type 2 diabetes mellitus without complications: Secondary | ICD-10-CM | POA: Insufficient documentation

## 2017-01-13 DIAGNOSIS — E118 Type 2 diabetes mellitus with unspecified complications: Secondary | ICD-10-CM

## 2017-01-13 NOTE — Progress Notes (Signed)

## 2017-01-20 ENCOUNTER — Encounter: Payer: Federal, State, Local not specified - PPO | Admitting: Dietician

## 2017-01-20 DIAGNOSIS — E119 Type 2 diabetes mellitus without complications: Secondary | ICD-10-CM | POA: Diagnosis not present

## 2017-01-20 DIAGNOSIS — E118 Type 2 diabetes mellitus with unspecified complications: Secondary | ICD-10-CM

## 2017-01-20 DIAGNOSIS — Z713 Dietary counseling and surveillance: Secondary | ICD-10-CM | POA: Diagnosis not present

## 2017-01-20 NOTE — Progress Notes (Signed)
Patient was seen on 01/20/17 for the third of a series of three diabetes self-management courses at the [facility].   Lindsey Beasley. State the amount of activity recommended for healthy living . Describe activities suitable for individual needs . Identify ways to regularly incorporate activity into daily life . Identify barriers to activity and ways to over come these barriers  Identify diabetes medications being personally used and their primary action for lowering glucose and possible side effects . Describe role of stress on blood glucose and develop strategies to address psychosocial issues . Identify diabetes complications and ways to prevent them  Explain how to manage diabetes during illness . Evaluate success in meeting personal goal . Establish 2-3 goals that they will plan to diligently work on until they return for the  5381-month follow-up visit  Goals:   I will be active 30 minutes or more 5 times a week  I will take group exercise classes to keep moving and losing weight  I will eat less unhealthy fats by eating less potato chips and pork rinds  I will reduce my alcohol intake  Your patient has identified these potential barriers to change:  None - this is a lifestyle change I need to make  Your patient has identified their diabetes self-care support plan as  [facility] Support [facility] Apps for carb counting Plan:  Attend Support Group as desired

## 2017-01-27 DIAGNOSIS — K080 Exfoliation of teeth due to systemic causes: Secondary | ICD-10-CM | POA: Diagnosis not present

## 2017-02-23 ENCOUNTER — Ambulatory Visit (INDEPENDENT_AMBULATORY_CARE_PROVIDER_SITE_OTHER): Payer: Federal, State, Local not specified - PPO | Admitting: Allergy and Immunology

## 2017-02-23 ENCOUNTER — Encounter: Payer: Self-pay | Admitting: Allergy and Immunology

## 2017-02-23 VITALS — BP 162/88 | HR 68 | Temp 98.4°F | Resp 18 | Ht 64.37 in | Wt 248.6 lb

## 2017-02-23 DIAGNOSIS — J3089 Other allergic rhinitis: Secondary | ICD-10-CM

## 2017-02-23 DIAGNOSIS — T6391XA Toxic effect of contact with unspecified venomous animal, accidental (unintentional), initial encounter: Secondary | ICD-10-CM | POA: Diagnosis not present

## 2017-02-23 DIAGNOSIS — E119 Type 2 diabetes mellitus without complications: Secondary | ICD-10-CM | POA: Diagnosis not present

## 2017-02-23 MED ORDER — MONTELUKAST SODIUM 10 MG PO TABS: 10.0000 mg | ORAL_TABLET | Freq: Every day | ORAL | 5 refills | Status: DC

## 2017-02-23 MED ORDER — OLOPATADINE HCL 0.7 % OP SOLN: 1.0000 [drp] | Freq: Every day | OPHTHALMIC | 5 refills | Status: DC | PRN

## 2017-02-23 NOTE — Progress Notes (Signed)
Dear Dr. Hyacinth Meeker,  Thank you for referring Lindsey Beasley to the [facility] [place] on 02/23/2017.   Below is a summation of this patient's evaluation and recommendations.  Thank you for your referral. I will keep you informed about this patient's response to treatment.   If you have any questions please do not hesitate to contact me.   Sincerely,  Jessica Priest, MD Allergy / Immunology [facility]   ______________________________________________________________________    NEW PATIENT NOTE  Referring Provider: Sigmund Hazel, MD Primary Provider: Neldon Labella, MD Date of office visit: 02/23/2017    Subjective:   Chief Complaint:  Lindsey Beasley (DOB: Apr 15, 1966) is a 51 y.o. female who presents to the clinic on 02/23/2017 with a chief complaint of Allergic Rhinitis  .     HPI: Lindsey Beasley presents to this clinic in evaluation of persistent allergy problems that have been present for several years initially assuming a seasonal pattern but presently assuming a perennial pattern. She will develop nasal congestion and sneezing and nose blowing and itchy red watery eyes on a perennial basis with springtime exacerbation usually following exposure to the outdoors or change in weather and possibly precipitated by exposure to her work environment. She does not have any associated anosmia or ugly nasal discharge or other types of a stent topics symptoms. She has tried various antihistamines which do help somewhat as does the use of nasal saline. She is not sure that nasal steroids in the form of Flonase has really helped her very much.  In addition, she had a history of venom allergy. At the age of 89 she was stung by some type of flying insects and developed significant throat constriction and body swelling and required emergency room evaluation. In 2002 she was stung on the back by some flying insect  and developed "numbness" and  "dizziness" and was evaluated in the emergency room and given a shot. At that point she was assigned an EpiPen.  Past Medical History:  Diagnosis Date  . Diabetes mellitus without complication (HCC)   . Hypertension     Past Surgical History:  Procedure Laterality Date  . CESAREAN SECTION  1985, 1989    Allergies as of 02/23/2017      Reactions   Lisinopril Cough   Other    Bee stings   Penicillins       Medication List      amLODipine 10 MG tablet Commonly known as:  NORVASC Take 10 mg by mouth daily.   Fish Oil 1000 MG Caps Take 1,000 mg by mouth.   hydrochlorothiazide 12.5 MG capsule Commonly known as:  MICROZIDE Take 10 mg by mouth daily.   multivitamin with minerals Tabs tablet Take 1 tablet by mouth daily.   nebivolol 2.5 MG tablet Commonly known as:  BYSTOLIC Take 2.5 mg by mouth daily.       Review of systems negative except as noted in HPI / PMHx or noted below:  Review of Systems  Constitutional: Negative.   HENT: Negative.   Eyes: Negative.   Respiratory: Negative.   Cardiovascular: Negative.   Gastrointestinal: Negative.   Genitourinary: Negative.   Musculoskeletal: Negative.   Skin: Negative.   Neurological: Negative.   Endo/Heme/Allergies: Negative.   Psychiatric/Behavioral: Negative.     Family History  Problem Relation Age of Onset  . Diabetes Mother   . Kidney failure Mother   . High blood pressure  Mother   . Parkinson's disease Father     Social History   Social History  . Marital status: Single    Spouse name: N/A  . Number of children: N/A  . Years of education: N/A   Occupational History  . Not on file.   Social History Main Topics  . Smoking status: Former Smoker    Packs/day: 0.00    Types: Cigarettes    Quit date: 2017  . Smokeless tobacco: Never Used  . Alcohol use Yes  . Drug use: No  . Sexual activity: Not on file   Other Topics Concern  . Not on file   Social History  Narrative  . No narrative on file    Environmental and Social history  Lives in a townhouse with a dry environment, no animals located inside the household, carpeting in the bedroom, no plastic on the bed or pillow, no tobacco smoke exposure in the house, and employment in an office setting.  Objective:   Vitals:   02/23/17 0855  BP: (!) 162/88  Pulse: 68  Resp: 18  Temp: 98.4 F (36.9 C)   Height: 5' 4.37" (163.5 cm) Weight: 248 lb 9.6 oz (112.8 kg)  Physical Exam  Constitutional: She is well-developed, well-nourished, and in no distress.  HENT:  Head: Normocephalic. Head is without right periorbital erythema and without left periorbital erythema.  Right Ear: Tympanic membrane, external ear and ear canal normal.  Left Ear: Tympanic membrane, external ear and ear canal normal.  Nose: Mucosal edema present. No rhinorrhea.  Mouth/Throat: Oropharynx is clear and moist and mucous membranes are normal. No oropharyngeal exudate.  Eyes: Conjunctivae and lids are normal. Pupils are equal, round, and reactive to light.  Neck: Trachea normal. No tracheal deviation present. No thyromegaly present.  Cardiovascular: Normal rate, regular rhythm, S1 normal, S2 normal and normal heart sounds.   No murmur heard. Pulmonary/Chest: Effort normal. No stridor. No tachypnea. No respiratory distress. She has no wheezes. She has no rales. She exhibits no tenderness.  Abdominal: Soft. She exhibits no distension and no mass. There is no hepatosplenomegaly. There is no tenderness. There is no rebound and no guarding.  Musculoskeletal: She exhibits no edema or tenderness.  Lymphadenopathy:       Head (right side): No tonsillar adenopathy present.       Head (left side): No tonsillar adenopathy present.    She has no cervical adenopathy.    She has no axillary adenopathy.  Neurological: She is alert. Gait normal.  Skin: No rash noted. She is not diaphoretic. No erythema. No pallor. Nails show no  clubbing.  Psychiatric: Mood and affect normal.    Diagnostics: Allergy skin tests were performed. She demonstrated hypersensitivity to grasses, trees, and house dust mite.  Assessment and Plan:    1. Other allergic rhinitis   2. Toxic effect of venom, accidental or unintentional, initial encounter     1.  Allergen avoidance measures  2. Treat and prevent inflammation:   A. OTC Rhinocort/Nasacort one spray each nostril 3-7 times a week  B. montelukast 10 mg tablet 1 time per day  3. If needed:   A. nasal saline  B. OTC antihistamine  C. Pazeo one drop each eye one time per day  D. EpiPen, Benadryl, M.D./ER evaluation for allergic reaction  4. Venom clinic evaluation without any antihistamine  5. Return to clinic in summer 2018 or earlier if problem  6. Beta blocker use?  Aquanetta Will utilize a combination of  allergen avoidance measures and anti-inflammatory agents to see if we can get her atopic respiratory disease under better control. We should know within a few weeks whether or not this form of therapy will be adequate in preventing her from developing significant problems. If she fails medical therapy she would be a candidate for immunotherapy. As well, she needs to be evaluated in venom clinic to see if she is a candidate for immunotherapy directed against Hymenoptera species. She is on a beta blocker and there is a relative risk of utilizing a beta blocker in the setting of Hymenoptera allergy for her injectable epinephrine device may not work as well with a beta blocker on board and I had a talk with her today about this issue and she will address this issue with her family doctor. I will see her back in this clinic in the summer of 2018 or earlier if there is a problem.  Jessica Priest, MD [facility] [place]

## 2017-02-23 NOTE — Patient Instructions (Addendum)
  1.  Allergen avoidance measures  2. Treat and prevent inflammation:   A. OTC Rhinocort/Nasacort one spray each nostril 3-7 times a week  B. montelukast 10 mg tablet 1 time per day  3. If needed:   A. nasal saline  B. OTC antihistamine  C. Pazeo one drop each eye one time per day  D. EpiPen, Benadryl, M.D./ER evaluation for allergic reaction  4. Venom clinic evaluation without any antihistamine  5. Return to clinic in summer 2018 or earlier if problem  6. Beta blocker use?

## 2017-05-31 DIAGNOSIS — E781 Pure hyperglyceridemia: Secondary | ICD-10-CM | POA: Diagnosis not present

## 2017-05-31 DIAGNOSIS — R7989 Other specified abnormal findings of blood chemistry: Secondary | ICD-10-CM | POA: Diagnosis not present

## 2017-05-31 DIAGNOSIS — E119 Type 2 diabetes mellitus without complications: Secondary | ICD-10-CM | POA: Diagnosis not present

## 2017-05-31 DIAGNOSIS — E785 Hyperlipidemia, unspecified: Secondary | ICD-10-CM | POA: Diagnosis not present

## 2017-06-03 DIAGNOSIS — I10 Essential (primary) hypertension: Secondary | ICD-10-CM | POA: Diagnosis not present

## 2017-06-03 DIAGNOSIS — E781 Pure hyperglyceridemia: Secondary | ICD-10-CM | POA: Diagnosis not present

## 2017-06-03 DIAGNOSIS — R748 Abnormal levels of other serum enzymes: Secondary | ICD-10-CM | POA: Diagnosis not present

## 2017-06-03 DIAGNOSIS — E119 Type 2 diabetes mellitus without complications: Secondary | ICD-10-CM | POA: Diagnosis not present

## 2017-06-28 ENCOUNTER — Ambulatory Visit (INDEPENDENT_AMBULATORY_CARE_PROVIDER_SITE_OTHER): Payer: Federal, State, Local not specified - PPO | Admitting: Allergy and Immunology

## 2017-06-28 ENCOUNTER — Encounter: Payer: Self-pay | Admitting: Allergy and Immunology

## 2017-06-28 VITALS — BP 132/72 | HR 80 | Resp 20

## 2017-06-28 DIAGNOSIS — T6391XA Toxic effect of contact with unspecified venomous animal, accidental (unintentional), initial encounter: Secondary | ICD-10-CM | POA: Diagnosis not present

## 2017-06-28 DIAGNOSIS — J3089 Other allergic rhinitis: Secondary | ICD-10-CM

## 2017-06-28 MED ORDER — MONTELUKAST SODIUM 10 MG PO TABS: 10.0000 mg | ORAL_TABLET | Freq: Every day | ORAL | 11 refills | Status: DC

## 2017-06-28 NOTE — Patient Instructions (Signed)
  1.  Allergen avoidance measures  2. Treat and prevent inflammation:   A. OTC Rhinocort/Nasacort one spray each nostril 3-7 times a week  B. montelukast 10 mg tablet 1 time per day  3. If needed:   A. nasal saline  B. OTC antihistamine  C. Pazeo one drop each eye one time per day  D. EpiPen, Benadryl, M.D./ER evaluation for allergic reaction  4. Return to clinic in one year or earlier if problem  6. Obtain fall flu vaccine

## 2017-06-28 NOTE — Progress Notes (Signed)
Follow-up Note  Referring Provider: Sigmund Hazel, MD Primary Provider: Sigmund Hazel, MD Date of Office Visit: 06/28/2017  Subjective:   Lindsey Beasley (DOB: 1965-11-27) is a 51 y.o. female who [facility] in re-evaluation of the following:  HPI: Lindsey Beasley returns to this clinic in reevaluation of her allergic rhinitis. She was last seen in this clinic 02/23/2017.   Overall she has had a very good spring with her plan and she is very satisfied with the response that she has receiving regarding her allergic rhinoconjunctivitis. She consistently uses a nasal steroid and a montelukast tablet and occasionally uses some antihistamines and eyedrops.  She is not that interested in undergoing any further evaluation for her Hymenoptera venom hypersensitivity state and we will not be visiting our venom clinic.  Allergies as of 06/28/2017      Reactions   Bee Venom    Lisinopril Cough   Penicillins       Medication List      ALAWAY 0.025 % ophthalmic solution Generic drug:  ketotifen Place 1 drop into both eyes as needed.   amLODipine 10 MG tablet Commonly known as:  NORVASC Take 10 mg by mouth daily.   BYSTOLIC 10 MG tablet Generic drug:  nebivolol   cetirizine 10 MG tablet Commonly known as:  ZYRTEC Take 10 mg by mouth daily as needed for allergies.   EPIPEN 2-PAK 0.3 mg/0.3 mL Soaj injection Generic drug:  EPINEPHrine Use as directed for life-threatening allergic reaction.   Fish Oil 1000 MG Caps Take 1,000 mg by mouth.   loratadine 10 MG tablet Commonly known as:  CLARITIN Take 10 mg by mouth daily as needed for allergies.   montelukast 10 MG tablet Commonly known as:  SINGULAIR Take 1 tablet (10 mg total) by mouth at bedtime.   multivitamin with minerals Tabs tablet Take 1 tablet by mouth daily.   NASAL SALINE NA Place into the nose.       Past Medical History:  Diagnosis Date  . Diabetes mellitus without  complication (HCC)   . Hypertension     Past Surgical History:  Procedure Laterality Date  . CESAREAN SECTION  1985, 1989    Review of systems negative except as noted in HPI / PMHx or noted below:  Review of Systems  Constitutional: Negative.   HENT: Negative.   Eyes: Negative.   Respiratory: Negative.   Cardiovascular: Negative.   Gastrointestinal: Negative.   Genitourinary: Negative.   Musculoskeletal: Negative.   Skin: Negative.   Neurological: Negative.   Endo/Heme/Allergies: Negative.   Psychiatric/Behavioral: Negative.      Objective:   Vitals:   06/28/17 1703  BP: 132/72  Pulse: 80  Resp: 20          Physical Exam  Constitutional: She is well-developed, well-nourished, and in no distress.  HENT:  Head: Normocephalic.  Right Ear: Tympanic membrane, external ear and ear canal normal.  Left Ear: Tympanic membrane, external ear and ear canal normal.  Nose: Nose normal. No mucosal edema or rhinorrhea.  Mouth/Throat: Uvula is midline, oropharynx is clear and moist and mucous membranes are normal. No oropharyngeal exudate.  Eyes: Conjunctivae are normal.  Neck: Trachea normal. No tracheal tenderness present. No tracheal deviation present. No thyromegaly present.  Cardiovascular: Normal rate, regular rhythm, S1 normal, S2 normal and normal heart sounds.   No murmur heard. Pulmonary/Chest: Breath sounds normal. No stridor. No respiratory distress. She has no wheezes. She has  no rales.  Musculoskeletal: She exhibits no edema.  Lymphadenopathy:       Head (right side): No tonsillar adenopathy present.       Head (left side): No tonsillar adenopathy present.    She has no cervical adenopathy.  Neurological: She is alert. Gait normal.  Skin: No rash noted. She is not diaphoretic. No erythema. Nails show no clubbing.  Psychiatric: Mood and affect normal.    Diagnostics: none  Assessment and Plan:   1. Other allergic rhinitis   2. Toxic effect of venom,  accidental or unintentional, initial encounter     1.  Allergen avoidance measures  2. Treat and prevent inflammation:   A. OTC Rhinocort/Nasacort one spray each nostril 3-7 times a week  B. montelukast 10 mg tablet 1 time per day  3. If needed:   A. nasal saline  B. OTC antihistamine  C. Pazeo one drop each eye one time per day  D. EpiPen, Benadryl, M.D./ER evaluation for allergic reaction  4. Return to clinic in one year or earlier if problem  6. Obtain fall flu vaccine  Lindsey Beasley appears to be doing very well on her current medical therapy. Of course, the true test of this therapy's effectiveness will be determined as she goes through each season of the year. I once again had a talk with her today about the role of immunotherapy in treating her atopic disease and if she does fails medical therapy she always has the option to start a course of immunotherapy. She will not be undergoing any further evaluation for her venom hypersensitivity at this point but she is certainly welcome to explore that option at some point in the future should she so desire. I will see her back in this clinic in 1 year or earlier if there is a problem.  Laurette SchimkeEric Talani Brazee, MD Allergy / Immunology [facility]

## 2017-08-02 DIAGNOSIS — K080 Exfoliation of teeth due to systemic causes: Secondary | ICD-10-CM | POA: Diagnosis not present

## 2017-08-18 ENCOUNTER — Other Ambulatory Visit: Payer: Self-pay | Admitting: Obstetrics and Gynecology

## 2017-08-18 DIAGNOSIS — Z1231 Encounter for screening mammogram for malignant neoplasm of breast: Secondary | ICD-10-CM

## 2017-08-22 ENCOUNTER — Ambulatory Visit
Admission: RE | Admit: 2017-08-22 | Discharge: 2017-08-22 | Disposition: A | Discharge status: Home / Self Care | Payer: Federal, State, Local not specified - PPO | Source: Ambulatory Visit | Attending: Obstetrics and Gynecology | Admitting: Obstetrics and Gynecology

## 2017-08-22 DIAGNOSIS — Z1231 Encounter for screening mammogram for malignant neoplasm of breast: Secondary | ICD-10-CM | POA: Diagnosis not present

## 2017-08-29 ENCOUNTER — Ambulatory Visit: Payer: Federal, State, Local not specified - PPO

## 2017-11-01 DIAGNOSIS — Z01419 Encounter for gynecological examination (general) (routine) without abnormal findings: Secondary | ICD-10-CM | POA: Diagnosis not present

## 2017-12-02 DIAGNOSIS — E781 Pure hyperglyceridemia: Secondary | ICD-10-CM | POA: Diagnosis not present

## 2017-12-02 DIAGNOSIS — I10 Essential (primary) hypertension: Secondary | ICD-10-CM | POA: Diagnosis not present

## 2017-12-02 DIAGNOSIS — E119 Type 2 diabetes mellitus without complications: Secondary | ICD-10-CM | POA: Diagnosis not present

## 2017-12-02 DIAGNOSIS — R748 Abnormal levels of other serum enzymes: Secondary | ICD-10-CM | POA: Diagnosis not present

## 2017-12-13 DIAGNOSIS — Z0000 Encounter for general adult medical examination without abnormal findings: Secondary | ICD-10-CM | POA: Diagnosis not present

## 2017-12-13 DIAGNOSIS — R7303 Prediabetes: Secondary | ICD-10-CM | POA: Diagnosis not present

## 2017-12-13 DIAGNOSIS — I10 Essential (primary) hypertension: Secondary | ICD-10-CM | POA: Diagnosis not present

## 2017-12-13 DIAGNOSIS — E785 Hyperlipidemia, unspecified: Secondary | ICD-10-CM | POA: Diagnosis not present

## 2018-02-02 DIAGNOSIS — K080 Exfoliation of teeth due to systemic causes: Secondary | ICD-10-CM | POA: Diagnosis not present

## 2018-03-15 DIAGNOSIS — R748 Abnormal levels of other serum enzymes: Secondary | ICD-10-CM | POA: Diagnosis not present

## 2018-03-15 DIAGNOSIS — R7303 Prediabetes: Secondary | ICD-10-CM | POA: Diagnosis not present

## 2018-03-17 DIAGNOSIS — I10 Essential (primary) hypertension: Secondary | ICD-10-CM | POA: Diagnosis not present

## 2018-03-17 DIAGNOSIS — E1169 Type 2 diabetes mellitus with other specified complication: Secondary | ICD-10-CM | POA: Diagnosis not present

## 2018-03-27 DIAGNOSIS — S46811A Strain of other muscles, fascia and tendons at shoulder and upper arm level, right arm, initial encounter: Secondary | ICD-10-CM | POA: Diagnosis not present

## 2018-03-27 DIAGNOSIS — R079 Chest pain, unspecified: Secondary | ICD-10-CM | POA: Diagnosis not present

## 2018-03-28 ENCOUNTER — Other Ambulatory Visit: Payer: Federal, State, Local not specified - PPO | Admitting: Pediatrics

## 2018-04-05 DIAGNOSIS — N390 Urinary tract infection, site not specified: Secondary | ICD-10-CM | POA: Diagnosis not present

## 2018-04-05 DIAGNOSIS — R319 Hematuria, unspecified: Secondary | ICD-10-CM | POA: Diagnosis not present

## 2018-08-08 DIAGNOSIS — K080 Exfoliation of teeth due to systemic causes: Secondary | ICD-10-CM | POA: Diagnosis not present

## 2018-08-10 ENCOUNTER — Other Ambulatory Visit: Payer: Self-pay | Admitting: Obstetrics and Gynecology

## 2018-08-10 DIAGNOSIS — Z1231 Encounter for screening mammogram for malignant neoplasm of breast: Secondary | ICD-10-CM

## 2018-09-07 ENCOUNTER — Ambulatory Visit
Admission: RE | Admit: 2018-09-07 | Discharge: 2018-09-07 | Disposition: A | Discharge status: Home / Self Care | Payer: Federal, State, Local not specified - PPO | Source: Ambulatory Visit | Attending: Obstetrics and Gynecology | Admitting: Obstetrics and Gynecology

## 2018-09-07 DIAGNOSIS — Z1231 Encounter for screening mammogram for malignant neoplasm of breast: Secondary | ICD-10-CM

## 2018-09-17 ENCOUNTER — Other Ambulatory Visit: Payer: Self-pay | Admitting: Allergy and Immunology

## 2018-09-18 ENCOUNTER — Other Ambulatory Visit: Payer: Self-pay | Admitting: Allergy and Immunology

## 2018-10-25 DIAGNOSIS — K080 Exfoliation of teeth due to systemic causes: Secondary | ICD-10-CM | POA: Diagnosis not present

## 2018-11-23 DIAGNOSIS — Z01419 Encounter for gynecological examination (general) (routine) without abnormal findings: Secondary | ICD-10-CM | POA: Diagnosis not present

## 2018-12-19 DIAGNOSIS — E1169 Type 2 diabetes mellitus with other specified complication: Secondary | ICD-10-CM | POA: Diagnosis not present

## 2018-12-19 DIAGNOSIS — Z13220 Encounter for screening for lipoid disorders: Secondary | ICD-10-CM | POA: Diagnosis not present

## 2018-12-19 DIAGNOSIS — Z0000 Encounter for general adult medical examination without abnormal findings: Secondary | ICD-10-CM | POA: Diagnosis not present

## 2018-12-19 DIAGNOSIS — L309 Dermatitis, unspecified: Secondary | ICD-10-CM | POA: Diagnosis not present

## 2018-12-19 DIAGNOSIS — I10 Essential (primary) hypertension: Secondary | ICD-10-CM | POA: Diagnosis not present

## 2019-04-16 DIAGNOSIS — E785 Hyperlipidemia, unspecified: Secondary | ICD-10-CM | POA: Diagnosis not present

## 2019-04-16 DIAGNOSIS — E119 Type 2 diabetes mellitus without complications: Secondary | ICD-10-CM | POA: Diagnosis not present

## 2019-05-04 DIAGNOSIS — Z20828 Contact with and (suspected) exposure to other viral communicable diseases: Secondary | ICD-10-CM | POA: Diagnosis not present

## 2019-05-27 DIAGNOSIS — Z20828 Contact with and (suspected) exposure to other viral communicable diseases: Secondary | ICD-10-CM | POA: Diagnosis not present

## 2019-06-28 DIAGNOSIS — Z20828 Contact with and (suspected) exposure to other viral communicable diseases: Secondary | ICD-10-CM | POA: Diagnosis not present

## 2019-07-06 DIAGNOSIS — E1169 Type 2 diabetes mellitus with other specified complication: Secondary | ICD-10-CM | POA: Diagnosis not present

## 2019-07-06 DIAGNOSIS — I10 Essential (primary) hypertension: Secondary | ICD-10-CM | POA: Diagnosis not present

## 2019-07-06 DIAGNOSIS — E785 Hyperlipidemia, unspecified: Secondary | ICD-10-CM | POA: Diagnosis not present

## 2019-08-13 DIAGNOSIS — K080 Exfoliation of teeth due to systemic causes: Secondary | ICD-10-CM | POA: Diagnosis not present

## 2019-08-17 ENCOUNTER — Other Ambulatory Visit: Payer: Self-pay | Admitting: Obstetrics and Gynecology

## 2019-08-17 DIAGNOSIS — Z1231 Encounter for screening mammogram for malignant neoplasm of breast: Secondary | ICD-10-CM

## 2019-10-01 ENCOUNTER — Other Ambulatory Visit: Payer: Self-pay

## 2019-10-01 ENCOUNTER — Ambulatory Visit
Admission: RE | Admit: 2019-10-01 | Discharge: 2019-10-01 | Disposition: A | Discharge status: Home / Self Care | Payer: Federal, State, Local not specified - PPO | Source: Ambulatory Visit | Attending: Obstetrics and Gynecology | Admitting: Obstetrics and Gynecology

## 2019-10-01 ENCOUNTER — Other Ambulatory Visit: Payer: Self-pay | Admitting: Obstetrics and Gynecology

## 2019-10-01 DIAGNOSIS — Z1231 Encounter for screening mammogram for malignant neoplasm of breast: Secondary | ICD-10-CM | POA: Diagnosis not present

## 2019-11-16 DIAGNOSIS — M7120 Synovial cyst of popliteal space [Baker], unspecified knee: Secondary | ICD-10-CM

## 2019-11-16 HISTORY — DX: Synovial cyst of popliteal space (Baker), unspecified knee: M71.20

## 2019-11-28 DIAGNOSIS — Z20822 Contact with and (suspected) exposure to covid-19: Secondary | ICD-10-CM | POA: Diagnosis not present

## 2019-11-28 DIAGNOSIS — Z20828 Contact with and (suspected) exposure to other viral communicable diseases: Secondary | ICD-10-CM | POA: Diagnosis not present

## 2019-12-13 DIAGNOSIS — U071 COVID-19: Secondary | ICD-10-CM | POA: Diagnosis not present

## 2019-12-13 DIAGNOSIS — Z03818 Encounter for observation for suspected exposure to other biological agents ruled out: Secondary | ICD-10-CM | POA: Diagnosis not present

## 2020-01-11 DIAGNOSIS — E785 Hyperlipidemia, unspecified: Secondary | ICD-10-CM | POA: Diagnosis not present

## 2020-01-11 DIAGNOSIS — E119 Type 2 diabetes mellitus without complications: Secondary | ICD-10-CM | POA: Diagnosis not present

## 2020-01-14 DIAGNOSIS — E1169 Type 2 diabetes mellitus with other specified complication: Secondary | ICD-10-CM | POA: Diagnosis not present

## 2020-01-14 DIAGNOSIS — Z0000 Encounter for general adult medical examination without abnormal findings: Secondary | ICD-10-CM | POA: Diagnosis not present

## 2020-01-14 DIAGNOSIS — E785 Hyperlipidemia, unspecified: Secondary | ICD-10-CM | POA: Diagnosis not present

## 2020-01-14 DIAGNOSIS — I10 Essential (primary) hypertension: Secondary | ICD-10-CM | POA: Diagnosis not present

## 2020-01-14 DIAGNOSIS — R748 Abnormal levels of other serum enzymes: Secondary | ICD-10-CM | POA: Diagnosis not present

## 2020-02-21 ENCOUNTER — Ambulatory Visit: Payer: Federal, State, Local not specified - PPO | Attending: Internal Medicine

## 2020-02-21 DIAGNOSIS — Z23 Encounter for immunization: Secondary | ICD-10-CM

## 2020-02-21 NOTE — Progress Notes (Signed)
   Covid-19 Vaccination Clinic  Name:  Lindsey Beasley    MRN: [id number] DOB: 01/23/1966  02/21/2020  [name] was observed post Covid-19 immunization for 30 minutes based on pre-vaccination screening without incident. She was provided with Vaccine Information Sheet and instruction to access the V-Safe system.   [name] was instructed to call 911 with any severe reactions post vaccine: Marland Kitchen Difficulty breathing  . Swelling of face and throat  . A fast heartbeat  . A bad rash all over body  . Dizziness and weakness   Immunizations Administered    Name Date Dose VIS Date Route   Pfizer COVID-19 Vaccine 02/21/2020  3:52 PM 0.3 mL 10/26/2019 Intramuscular   Manufacturer: [employer], [employer]   Lot: [id number]   NDC: [address]-4

## 2020-03-17 ENCOUNTER — Ambulatory Visit: Payer: Federal, State, Local not specified - PPO | Attending: Internal Medicine

## 2020-03-17 DIAGNOSIS — Z23 Encounter for immunization: Secondary | ICD-10-CM

## 2020-03-17 NOTE — Progress Notes (Signed)
   Covid-19 Vaccination Clinic  Name:  Lindsey Beasley    MRN: [id number] DOB: 11/19/1965  03/17/2020  Lindsey Beasley was observed post Covid-19 immunization for 30 minutes based on pre-vaccination screening without incident. She was provided with Vaccine Information Sheet and instruction to access the V-Safe system.   Lindsey Beasley was instructed to call 911 with any severe reactions post vaccine: Marland Kitchen Difficulty breathing  . Swelling of face and throat  . A fast heartbeat  . A bad rash all over body  . Dizziness and weakness   Immunizations Administered    Name Date Dose VIS Date Route   Pfizer COVID-19 Vaccine 03/17/2020  3:49 PM 0.3 mL 01/09/2019 Intramuscular   Manufacturer: [employer], [employer]   Lot: [id number]   NDC: [address]-9

## 2020-05-02 DIAGNOSIS — E785 Hyperlipidemia, unspecified: Secondary | ICD-10-CM | POA: Diagnosis not present

## 2020-05-02 DIAGNOSIS — E1169 Type 2 diabetes mellitus with other specified complication: Secondary | ICD-10-CM | POA: Diagnosis not present

## 2020-05-05 DIAGNOSIS — E785 Hyperlipidemia, unspecified: Secondary | ICD-10-CM | POA: Diagnosis not present

## 2020-05-05 DIAGNOSIS — Z6841 Body Mass Index (BMI) 40.0 and over, adult: Secondary | ICD-10-CM | POA: Diagnosis not present

## 2020-05-05 DIAGNOSIS — E1169 Type 2 diabetes mellitus with other specified complication: Secondary | ICD-10-CM | POA: Diagnosis not present

## 2020-05-05 DIAGNOSIS — I10 Essential (primary) hypertension: Secondary | ICD-10-CM | POA: Diagnosis not present

## 2020-05-07 ENCOUNTER — Emergency Department (HOSPITAL_BASED_OUTPATIENT_CLINIC_OR_DEPARTMENT_OTHER): Payer: Federal, State, Local not specified - PPO

## 2020-05-07 ENCOUNTER — Emergency Department (HOSPITAL_COMMUNITY)
Admission: EM | Admit: 2020-05-07 | Discharge: 2020-05-07 | Disposition: A | Discharge status: Home / Self Care | Payer: Federal, State, Local not specified - PPO | Attending: Emergency Medicine | Admitting: Emergency Medicine

## 2020-05-07 ENCOUNTER — Other Ambulatory Visit: Payer: Self-pay

## 2020-05-07 ENCOUNTER — Encounter (HOSPITAL_COMMUNITY): Payer: Self-pay

## 2020-05-07 DIAGNOSIS — E119 Type 2 diabetes mellitus without complications: Secondary | ICD-10-CM | POA: Insufficient documentation

## 2020-05-07 DIAGNOSIS — M7989 Other specified soft tissue disorders: Secondary | ICD-10-CM | POA: Diagnosis not present

## 2020-05-07 DIAGNOSIS — F17210 Nicotine dependence, cigarettes, uncomplicated: Secondary | ICD-10-CM | POA: Diagnosis not present

## 2020-05-07 DIAGNOSIS — M7121 Synovial cyst of popliteal space [Baker], right knee: Secondary | ICD-10-CM | POA: Diagnosis not present

## 2020-05-07 DIAGNOSIS — I10 Essential (primary) hypertension: Secondary | ICD-10-CM | POA: Diagnosis not present

## 2020-05-07 DIAGNOSIS — R609 Edema, unspecified: Secondary | ICD-10-CM | POA: Diagnosis not present

## 2020-05-07 DIAGNOSIS — M79604 Pain in right leg: Secondary | ICD-10-CM

## 2020-05-07 DIAGNOSIS — R2241 Localized swelling, mass and lump, right lower limb: Secondary | ICD-10-CM | POA: Diagnosis not present

## 2020-05-07 NOTE — ED Provider Notes (Signed)
[facility]-EMERGENCY DEPT Provider Note   CSN: [id number] Arrival date & time: 05/07/20  1827     History Chief Complaint  Patient presents with  . Leg Pain  . Leg Swelling    Lindsey Beasley is a 54 y.o. female with history of hypertension, diabetes mellitus presents sent from [facility] walk-in clinic for evaluation of acute onset, persistent right lower extremity pain and swelling.  Reports that symptoms began with right calf pain radiating to the right popliteal fossa on Sunday 4 days ago.  On Monday she noticed the swelling which improved and then worsened again.  She has noted some improvement in her symptoms with elevation.  Pain worsens with dangling legs against gravity and sometimes with attempts to ambulate.  The pain is throbbing, aching, at times sharp.  She has taken ibuprofen with a little relief of symptoms.  She denies chest pain, shortness of breath, recent travel or surgeries, hemoptysis, prior history of DVT or PE, or hormone replacement therapy.  Went to the [facility] walk-in clinic today and was sent here for DVT study.  Denies any known trauma.  The history is provided by the patient.       Past Medical History:  Diagnosis Date  . Diabetes mellitus without complication (HCC)   . Hypertension     There are no problems to display for this patient.   Past Surgical History:  Procedure Laterality Date  . BREAST BIOPSY Left   . CESAREAN SECTION  1985, 1989     OB History   No obstetric history on file.     Family History  Problem Relation Age of Onset  . Diabetes Mother   . Kidney failure Mother   . High blood pressure Mother   . Parkinson's disease Father   . Breast cancer Cousin     Social History   Tobacco Use  . Smoking status: Former Smoker    Packs/day: 0.00    Types: Cigarettes    Quit date: 2017    Years since quitting: 4.4  . Smokeless tobacco: Never Used  Substance Use Topics  . Alcohol use: Yes  . Drug use:  No    Home Medications Prior to Admission medications   Medication Sig Start Date End Date Taking? Authorizing Provider  amLODipine (NORVASC) 10 MG tablet Take 10 mg by mouth daily.    [provider]  BYSTOLIC 10 MG tablet  11/26/16   [provider]  cetirizine (ZYRTEC) 10 MG tablet Take 10 mg by mouth daily as needed for allergies.    [provider]  EPINEPHrine (EPIPEN 2-PAK) 0.3 mg/0.3 mL IJ SOAJ injection Use as directed for life-threatening allergic reaction.    [provider]  ketotifen (ALAWAY) 0.025 % ophthalmic solution Place 1 drop into both eyes as needed.    [provider]  loratadine (CLARITIN) 10 MG tablet Take 10 mg by mouth daily as needed for allergies.    [provider]  montelukast (SINGULAIR) 10 MG tablet TAKE 1 TABLET(10 MG) BY MOUTH AT BEDTIME 09/18/18   Kozlow, Alvira Philips, MD  Multiple Vitamin (MULTIVITAMIN WITH MINERALS) TABS tablet Take 1 tablet by mouth daily.    [provider]  NASAL SALINE NA Place into the nose.    [provider]  Omega-3 Fatty Acids (FISH OIL) 1000 MG CAPS Take 1,000 mg by mouth.    [provider]    Allergies    Bee venom, Lisinopril, and Penicillins  Review  of Systems   Review of Systems  Constitutional: Negative for chills and fever.  Respiratory: Negative for shortness of breath.   Cardiovascular: Positive for leg swelling. Negative for chest pain.  Musculoskeletal: Positive for myalgias.  All other systems reviewed and are negative.   Physical Exam Updated Vital Signs BP (!) 172/115   Pulse 77   Temp 98.6 F (37 C) (Oral)   Resp 18   SpO2 100%   Physical Exam Vitals and nursing note reviewed.  Constitutional:      General: She is not in acute distress.    Appearance: She is well-developed.  HENT:     Head: Normocephalic and atraumatic.  Eyes:     General:        Right eye: No discharge.        Left eye: No discharge.      Conjunctiva/sclera: Conjunctivae normal.  Neck:     Vascular: No JVD.     Trachea: No tracheal deviation.  Cardiovascular:     Rate and Rhythm: Normal rate.     Pulses: Normal pulses.     Comments: 2+ DP/PT pulses bilaterally.  Denna Haggard' sign present on the right.  Right calf measures 2 cm greater than the left calf circumferentially.  Palpable cords.  Edema is not pitting.   Pulmonary:     Effort: Pulmonary effort is normal.  Abdominal:     General: There is no distension.  Musculoskeletal:        General: Tenderness present.     Comments: No bony tenderness on examination of the right lower extremity.  There is tenderness to palpation of the medial aspect of the right knee mostly extending to the popliteal fossa and medial right calf.  5/5 strength of BLE major muscle groups.  Skin:    General: Skin is warm and dry.     Findings: No erythema.  Neurological:     Mental Status: She is alert.     Comments: Sensation intact to light touch of bilateral lower extremities  Psychiatric:        Behavior: Behavior normal.     ED Results / Procedures / Treatments   Labs (all labs ordered are listed, but only abnormal results are displayed) Labs Reviewed - No data to display  EKG None  Radiology VAS [place] LOWER EXTREMITY VENOUS (DVT) (ONLY [facility] & [facility] 7a-7p)  Result Date: 05/07/2020  Lower Venous DVTStudy Indications: Swelling, and Edema.  Comparison Study: no prior Performing Technologist: Blanch Media RVS  Examination Guidelines: A complete evaluation includes B-mode imaging, spectral Doppler, color Doppler, and power Doppler as needed of all accessible portions of each vessel. Bilateral testing is considered an integral part of a complete examination. Limited examinations for reoccurring indications may be performed as noted. The reflux portion of the exam is performed with the patient in reverse Trendelenburg.  +---------+---------------+---------+-----------+----------+--------------+ RIGHT     CompressibilityPhasicitySpontaneityPropertiesThrombus Aging +---------+---------------+---------+-----------+----------+--------------+ CFV      Full           Yes      Yes                                 +---------+---------------+---------+-----------+----------+--------------+ SFJ      Full                                                        +---------+---------------+---------+-----------+----------+--------------+  FV Prox  Full                                                        +---------+---------------+---------+-----------+----------+--------------+ FV Mid                  Yes      Yes                                 +---------+---------------+---------+-----------+----------+--------------+ FV Distal               Yes      Yes                                 +---------+---------------+---------+-----------+----------+--------------+ PFV      Full                                                        +---------+---------------+---------+-----------+----------+--------------+ POP      Full           Yes      Yes                                 +---------+---------------+---------+-----------+----------+--------------+ PTV      Full                                                        +---------+---------------+---------+-----------+----------+--------------+ PERO                                                  Not visualized +---------+---------------+---------+-----------+----------+--------------+   +----+---------------+---------+-----------+----------+--------------+ LEFTCompressibilityPhasicitySpontaneityPropertiesThrombus Aging +----+---------------+---------+-----------+----------+--------------+ CFV Full           Yes      Yes                                 +----+---------------+---------+-----------+----------+--------------+     Summary: RIGHT: - There is no evidence of deep vein thrombosis in the lower  extremity.  - A cystic structure is found in the popliteal fossa.  LEFT: - No evidence of common femoral vein obstruction.  *See table(s) above for measurements and observations.    Preliminary     Procedures Procedures (including critical care time)  Medications Ordered in ED Medications - No data to display  ED Course  I have reviewed the triage vital signs and the nursing notes.  Pertinent labs & imaging results that were available during my care of the patient were reviewed by me and considered in my medical decision making (see chart for details).    MDM Rules/Calculators/A&P  Patient presenting sent from PCP for DVT rule out.  She is afebrile, hypertensive in the ED with improvement on reevaluation.  Appears mildly anxious and uncomfortable which could be contributing to her hypertension.  She is ambulatory despite pain.  She is neurovascularly intact.  Denna Haggard' sign is present on the right.  Her right lower leg is a little more swollen compared to the left though this could be within normal limits as her dominant side is the right.  He has no chest pain or shortness of breath to suggest PE and she has no tachypnea or hypoxia or increased work of breathing.  DVT study today shows no evidence of DVT but does show a cystic structure in the popliteal fossa consistent with likely Baker's cyst.  This could be contributing to her symptoms.  No concern for septic arthritis, osteomyelitis, or secondary skin infection.  Will discharge with knee sleeve and instructions to follow-up with PCP or orthopedics on an outpatient basis.  She will try NSAIDs, Tylenol, elevation, heat therapy/ice therapy as tolerated.  Discussed strict ED return precautions. Patient verbalized understanding of and agreement with plan and is safe for discharge home at this time.     Final Clinical Impression(s) / ED Diagnoses Final diagnoses:  Synovial cyst of right popliteal space  Acute pain of  right lower extremity  Hypertension, unspecified type    Rx / DC Orders ED Discharge Orders    None       Bennye Alm 05/07/20 1947    Maia Plan, MD 05/08/20 2043

## 2020-05-07 NOTE — Discharge Instructions (Addendum)
Your ultrasound today did not show any sign of blood clot.  It did show a cystic structure to the back of the knee which is consistent with Baker's cyst.  Sometimes the cyst can rupture and can be painful.  Apply compression such as with a knee sleeve as tolerated.  Elevate when you are not walking on the extremity.  You can alternate 600 mg of ibuprofen and [phone] mg of Tylenol every 3 hours as needed for pain. Do not exceed 4000 mg of Tylenol daily.  Take ibuprofen with food to avoid upset stomach issues.  Monitor your blood pressures while taking ibuprofen and stop if they get too high.  Apply ice or heat, whichever feels best, during that time a few times daily.  Do some gentle stretching to avoid muscle stiffness.  Follow-up with your primary care provider or orthopedist for reevaluation of symptoms.  Return to the emergency department if any concerning signs or symptoms develop such as severe swelling, uncontrolled pain, loss of pulses, weakness, chest pain or shortness of breath.

## 2020-05-07 NOTE — ED Triage Notes (Addendum)
Pt sent by PCP to rule out DVT. Pt reports right lower leg pain that started on Sunday. Pt reports she thought maybe she just hit it on something and it was sore from that. Pt states she started to notice swelling on Monday which has gotten worse since. Pt states the swelling was worse yesterday than today. Pts right leg looks more swollen that left. Bilateral pedal pulses present and equal.

## 2020-05-07 NOTE — Progress Notes (Signed)
Lower extremity venous has been completed.   Preliminary results in CV Proc.   Blanch Media 05/07/2020 7:28 PM

## 2020-05-07 NOTE — Progress Notes (Signed)
Orthopedic Tech Progress Note Patient Details:  Lindsey Beasley [facility] [birth year] [id number]  Ortho Devices Ortho Device/Splint Location: Hinged knee brace Ortho Device/Splint Interventions: Application   Post Interventions Patient Tolerated: Well Instructions Provided: Care of device   Saul Fordyce 05/07/2020, 7:58 PM

## 2020-06-26 ENCOUNTER — Ambulatory Visit: Payer: Federal, State, Local not specified - PPO | Admitting: Orthopaedic Surgery

## 2020-06-26 ENCOUNTER — Encounter: Payer: Self-pay | Admitting: Orthopaedic Surgery

## 2020-06-26 ENCOUNTER — Ambulatory Visit (INDEPENDENT_AMBULATORY_CARE_PROVIDER_SITE_OTHER): Payer: Federal, State, Local not specified - PPO

## 2020-06-26 VITALS — Ht 64.5 in | Wt 255.0 lb

## 2020-06-26 DIAGNOSIS — M1711 Unilateral primary osteoarthritis, right knee: Secondary | ICD-10-CM

## 2020-06-26 MED ORDER — BUPIVACAINE HCL 0.25 % IJ SOLN
2.0000 mL | INTRAMUSCULAR | Status: AC | PRN
  Administered 2020-06-26: 2 mL via INTRA_ARTICULAR

## 2020-06-26 MED ORDER — METHYLPREDNISOLONE ACETATE 40 MG/ML IJ SUSP
40.0000 mg | INTRAMUSCULAR | Status: AC | PRN
  Administered 2020-06-26: 40 mg via INTRA_ARTICULAR

## 2020-06-26 MED ORDER — LIDOCAINE HCL 1 % IJ SOLN
2.0000 mL | INTRAMUSCULAR | Status: AC | PRN
  Administered 2020-06-26: 2 mL

## 2020-06-26 NOTE — Progress Notes (Signed)
Office Visit Note   Patient: Lindsey Beasley           Date of Birth: 16-Jan-1966           MRN: [id number] Visit Date: 06/26/2020              Requested by: Sigmund Hazel, MD [address] [place],  [place] [address] PCP: Sigmund Hazel, MD   Assessment & Plan: Visit Diagnoses:  1. Primary osteoarthritis of right knee     Plan: Impression is right knee arthritis flareup with possible degenerative medial meniscus tear.  We will inject the right knee with cortisone today.  She will follow up with [place] as needed.  Follow-Up Instructions: Return if symptoms worsen or fail to improve.   Orders:  Orders Placed This Encounter  Procedures  . Large Joint Inj: R knee  . XR KNEE 3 VIEW RIGHT   No orders of the defined types were placed in this encounter.     Procedures: Large Joint Inj: R knee on 06/26/2020 9:40 AM Indications: pain Details: 22 G needle, anterolateral approach Medications: 2 mL lidocaine 1 %; 2 mL bupivacaine 0.25 %; 40 mg methylPREDNISolone acetate 40 MG/ML      Clinical Data: No additional findings.   Subjective: Chief Complaint  Patient presents with  . Right Knee - Pain    HPI patient is a pleasant 54 year old female who comes in today with right knee pain.  This began almost 2 months ago.  No specific injury but she notes that she started taking care of her 70-month-old grandson just a few weeks prior to the onset of her pain.  Majority of her pain is to the posterior and medial aspect.  She does note a constant soreness to her left calf.  She has occasional locking and catching as well.  No instability.  She does note increased swelling at night.  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  She was seen by her PCP where Doppler ultrasound was obtained which showed a Baker's cyst.  No evidence of DVT.  He has been taking oral anti-inflammatories without significant relief of symptoms.  No  previous cortisone injection.  Review of Systems as detailed in HPI.  All others reviewed and are negative.   Objective: Vital Signs: Ht 5' 4.5" (1.638 m)   Wt 255 lb (115.7 kg)   BMI 43.09 kg/m   Physical Exam well-developed well-nourished female no acute distress.  Alert and oriented x3.  Ortho Exam examination of her right knee shows a trace effusion.  Range of motion from 0 to 115 degrees.  Medial and lateral joint line tenderness.  Ligaments are stable.  Moderate patellofemoral crepitus.  Mild calf tenderness.  She is neurovascular intact distally.  Specialty Comments:  No specialty comments available.  Imaging: XR KNEE 3 VIEW RIGHT  Result Date: 06/26/2020 Moderate tricompartmental degenerative changes    PMFS History: There are no problems to display for this patient.  Past Medical History:  Diagnosis Date  . Diabetes mellitus without complication (HCC)   . Hypertension     Family History  Problem Relation Age of Onset  . Diabetes Mother   . Kidney failure Mother   . High blood pressure Mother   . Parkinson's disease Father   . Breast cancer Cousin     Past Surgical History:  Procedure Laterality Date  . BREAST BIOPSY Left   . CESAREAN SECTION  14, 1989   Social History   Occupational History  . Not on file  Tobacco Use  . Smoking status: Former Smoker    Packs/day: 0.00    Types: Cigarettes    Quit date: 2017    Years since quitting: 4.6  . Smokeless tobacco: Never Used  Substance and Sexual Activity  . Alcohol use: Yes  . Drug use: No  . Sexual activity: Not on file

## 2020-07-11 ENCOUNTER — Telehealth: Payer: Self-pay | Admitting: Orthopaedic Surgery

## 2020-07-11 NOTE — Telephone Encounter (Signed)
Noted  

## 2020-07-11 NOTE — Telephone Encounter (Signed)
Can you order for her please

## 2020-07-11 NOTE — Telephone Encounter (Signed)
Patient called advised she is still in a lot of pain and she has tried various things to get some relief but, nothing has helped. Patient said there is a lot of swelling in her right knee and leg. Patient said her calf is also swollen. Patient said she is taking Ibuprofen and alternating Advil. Patient said she did not get any relief from the injection. The number to contact patient is [phone]

## 2020-07-11 NOTE — Telephone Encounter (Signed)
She has already had doppler u/s, so don't think we need that.  She has moderate djd, so I think best to get visco approval

## 2020-07-14 ENCOUNTER — Telehealth: Payer: Self-pay

## 2020-07-14 NOTE — Telephone Encounter (Signed)
Submitted VOB, Durolane, right knee. 

## 2020-07-18 ENCOUNTER — Encounter (HOSPITAL_COMMUNITY): Payer: Self-pay

## 2020-07-18 ENCOUNTER — Emergency Department (HOSPITAL_COMMUNITY)
Admission: EM | Admit: 2020-07-18 | Discharge: 2020-07-18 | Disposition: A | Discharge status: Home / Self Care | Payer: Federal, State, Local not specified - PPO | Attending: Emergency Medicine | Admitting: Emergency Medicine

## 2020-07-18 ENCOUNTER — Emergency Department (HOSPITAL_BASED_OUTPATIENT_CLINIC_OR_DEPARTMENT_OTHER): Payer: Federal, State, Local not specified - PPO

## 2020-07-18 ENCOUNTER — Other Ambulatory Visit: Payer: Self-pay

## 2020-07-18 DIAGNOSIS — M7989 Other specified soft tissue disorders: Secondary | ICD-10-CM

## 2020-07-18 DIAGNOSIS — I10 Essential (primary) hypertension: Secondary | ICD-10-CM | POA: Insufficient documentation

## 2020-07-18 DIAGNOSIS — E119 Type 2 diabetes mellitus without complications: Secondary | ICD-10-CM | POA: Insufficient documentation

## 2020-07-18 DIAGNOSIS — R609 Edema, unspecified: Secondary | ICD-10-CM | POA: Diagnosis not present

## 2020-07-18 DIAGNOSIS — Z87891 Personal history of nicotine dependence: Secondary | ICD-10-CM | POA: Diagnosis not present

## 2020-07-18 DIAGNOSIS — R2243 Localized swelling, mass and lump, lower limb, bilateral: Secondary | ICD-10-CM | POA: Diagnosis not present

## 2020-07-18 HISTORY — DX: Synovial cyst of popliteal space (Baker), unspecified knee: M71.20

## 2020-07-18 NOTE — Progress Notes (Signed)
Lower extremity venous has been completed.   Preliminary results in CV Proc.   Blanch Media 07/18/2020 6:29 PM

## 2020-07-18 NOTE — Discharge Instructions (Addendum)
The type of leg swelling and pain you are having, is not clear.  It could be from the knee joint, as the orthopedic doctor suggested.  However there are other joint disorders which could be causing it.  Also veins can be a problem causing this type of swelling.  We are referring you to a rheumatologist, and a vascular doctor for further evaluation and treatment.  In the meantime try elevating your legs above your heart is much as possible and continue taking ibuprofen for pain.

## 2020-07-18 NOTE — ED Provider Notes (Signed)
[facility]-EMERGENCY DEPT Provider Note   CSN: [id number] Arrival date & time: 07/18/20  1708     History Chief Complaint  Patient presents with  . Leg Swelling    Lindsey Beasley is a 54 y.o. female.  HPI She presents for evaluation of right leg swelling, ongoing for several months, despite evaluation and treatment in the ED, and by orthopedics.  She recently had a injection of the right knee, "for arthritis."  She states that this did not alter the course which is persistent pain and swelling.  She denies known trauma.  No other complaints, currently.  The discomfort makes it hard to walk because of the pain.  She is using over-the-counter NSAIDs without relief.  She has not had other comprehensive evaluations.    Past Medical History:  Diagnosis Date  . Baker's cyst   . Diabetes mellitus without complication (HCC)   . Hypertension     There are no problems to display for this patient.   Past Surgical History:  Procedure Laterality Date  . BREAST BIOPSY Left   . CESAREAN SECTION  1985, 1989     OB History   No obstetric history on file.     Family History  Problem Relation Age of Onset  . Diabetes Mother   . Kidney failure Mother   . High blood pressure Mother   . Parkinson's disease Father   . Breast cancer Cousin     Social History   Tobacco Use  . Smoking status: Former Smoker    Packs/day: 0.00    Types: Cigarettes    Quit date: 2017    Years since quitting: 4.6  . Smokeless tobacco: Never Used  Vaping Use  . Vaping Use: Never used  Substance Use Topics  . Alcohol use: Yes  . Drug use: No    Home Medications Prior to Admission medications   Medication Sig Start Date End Date Taking? Authorizing Provider  amLODipine (NORVASC) 10 MG tablet Take 10 mg by mouth daily.    [provider]  BYSTOLIC 10 MG tablet  11/26/16   [provider]  cetirizine (ZYRTEC) 10 MG tablet Take 10 mg by mouth daily as  needed for allergies.    [provider]  EPINEPHrine (EPIPEN 2-PAK) 0.3 mg/0.3 mL IJ SOAJ injection Use as directed for life-threatening allergic reaction.    [provider]  ketotifen (ALAWAY) 0.025 % ophthalmic solution Place 1 drop into both eyes as needed.    [provider]  loratadine (CLARITIN) 10 MG tablet Take 10 mg by mouth daily as needed for allergies.    [provider]  montelukast (SINGULAIR) 10 MG tablet TAKE 1 TABLET(10 MG) BY MOUTH AT BEDTIME 09/18/18   Kozlow, Alvira Philips, MD  Multiple Vitamin (MULTIVITAMIN WITH MINERALS) TABS tablet Take 1 tablet by mouth daily.    [provider]  NASAL SALINE NA Place into the nose.    [provider]  Omega-3 Fatty Acids (FISH OIL) 1000 MG CAPS Take 1,000 mg by mouth.    [provider]    Allergies    Bee venom, Lisinopril, and Penicillins  Review of Systems   Review of Systems  All other systems reviewed and are negative.   Physical Exam Updated Vital Signs BP (!) 168/87 (BP Location: Right Arm)   Pulse 80   Temp 98 F (36.7 C) (Oral)   Resp 16   Ht 5\' 6"  (1.676 m)   Wt 113.4  kg   LMP 12/19/2019   SpO2 99%   BMI 40.35 kg/m   Physical Exam Vitals and nursing note reviewed.  Constitutional:      General: She is not in acute distress.    Appearance: She is well-developed. She is obese. She is not ill-appearing, toxic-appearing or diaphoretic.  HENT:     Head: Normocephalic and atraumatic.     Right Ear: External ear normal.     Left Ear: External ear normal.  Eyes:     Conjunctiva/sclera: Conjunctivae normal.     Pupils: Pupils are equal, round, and reactive to light.  Neck:     Trachea: Phonation normal.  Cardiovascular:     Rate and Rhythm: Normal rate.  Pulmonary:     Effort: Pulmonary effort is normal. No respiratory distress.     Breath sounds: No stridor.  Abdominal:     General: There is no distension.  Musculoskeletal:     Cervical back:  Normal range of motion and neck supple.     Comments: Moderate swelling right leg, from hip to toes, with decreased flexion right knee secondary to discomfort and swelling.  Skin of right leg appears the same color as the skin of the left leg.  Skin:    General: Skin is warm and dry.  Neurological:     Mental Status: She is alert and oriented to person, place, and time.     Cranial Nerves: No cranial nerve deficit.     Sensory: No sensory deficit.     Motor: No abnormal muscle tone.     Coordination: Coordination normal.  Psychiatric:        Mood and Affect: Mood normal.        Behavior: Behavior normal.        Thought Content: Thought content normal.        Judgment: Judgment normal.     ED Results / Procedures / Treatments   Labs (all labs ordered are listed, but only abnormal results are displayed) Labs Reviewed - No data to display  EKG None  Radiology VAS [place] LOWER EXTREMITY VENOUS (DVT) (ONLY [facility] & [facility])  Result Date: 07/18/2020  Lower Venous DVTStudy Indications: Swelling, and Edema.  Limitations: Unable to tolerate compression. Comparison Study: 05/07/20 previous Performing Technologist: Blanch Media RVS  Examination Guidelines: A complete evaluation includes B-mode imaging, spectral Doppler, color Doppler, and power Doppler as needed of all accessible portions of each vessel. Bilateral testing is considered an integral part of a complete examination. Limited examinations for reoccurring indications may be performed as noted. The reflux portion of the exam is performed with the patient in reverse Trendelenburg.  +---------+---------------+---------+-----------+----------+--------------+ RIGHT    CompressibilityPhasicitySpontaneityPropertiesThrombus Aging +---------+---------------+---------+-----------+----------+--------------+ CFV      Full           Yes      Yes                                 +---------+---------------+---------+-----------+----------+--------------+  SFJ      Full                                                        +---------+---------------+---------+-----------+----------+--------------+ FV Prox  Full                                                        +---------+---------------+---------+-----------+----------+--------------+  FV Mid   Full                                                        +---------+---------------+---------+-----------+----------+--------------+ FV DistalFull                                                        +---------+---------------+---------+-----------+----------+--------------+ PFV                                                   Not visualized +---------+---------------+---------+-----------+----------+--------------+ POP      Full           Yes      Yes                                 +---------+---------------+---------+-----------+----------+--------------+ PTV      Full                                                        +---------+---------------+---------+-----------+----------+--------------+ PERO                                                  Not visualized +---------+---------------+---------+-----------+----------+--------------+   +----+---------------+---------+-----------+----------+--------------+ LEFTCompressibilityPhasicitySpontaneityPropertiesThrombus Aging +----+---------------+---------+-----------+----------+--------------+ CFV                                              Not visualized +----+---------------+---------+-----------+----------+--------------+     Summary: RIGHT: - There is no evidence of deep vein thrombosis in the lower extremity.  - A cystic structure is found in the popliteal fossa.  LEFT: - No evidence of common femoral vein obstruction.  *See table(s) above for measurements and observations.    Preliminary     Procedures Procedures (including critical care time)  Medications Ordered in ED Medications  - No data to display  ED Course  I have reviewed the triage vital signs and the nursing notes.  Pertinent labs & imaging results that were available during my care of the patient were reviewed by me and considered in my medical decision making (see chart for details).    MDM Rules/Calculators/A&P                           Patient Vitals for the past 24 hrs:  BP Temp Temp src Pulse Resp SpO2 Height Weight  07/18/20 1840 (!) 168/87 98 F (36.7 C) Oral 80 16 99 % -- --  07/18/20 1718 (!) 179/78 98.4 F (36.9 C) Oral 71 18 98 % 5\' 6"  (  1.676 m) 113.4 kg    6:44 PM Reevaluation with update and discussion. After initial assessment and treatment, an updated evaluation reveals no change in clinical status, findings cussed with the patient and all questions were answered. Mancel Bale   Medical Decision Making:  This patient is presenting for evaluation of atraumatic swelling and pain of the right leg, which does not require a range of treatment options, and is not a complaint that involves a high risk of morbidity and mortality. The differential diagnoses include DVT, vascular insufficiency, cellulitis, fracture. I decided to review old records, and in summary obese female presenting with ongoing pain and swelling in right leg, including knee, despite prior treatments including knee injection by orthopedics and ongoing treatment with ibuprofen.  She had Doppler imaging, early on in the course, which showed only a Baker's cyst.  She does not have other complaints at the time of evaluation.  I did not require additional historical information from anyone.  Vascular imaging by vascular technician shows absence of DVT right leg.    Critical Interventions-subacute pain swelling right leg and knee.  Doppler imaging negative today for DVT.  Doubt fracture, cellulitis, radiculopathy, or significant obstructive disease from the more proximal vascular structures.  After These Interventions, the  Patient was reevaluated and was found stable for discharge.  Patient has swelling and pain right leg and knee, ongoing for several months, without trauma.  Screening evaluation is reassuring ED.  Vascular occlusion of vein ruled out.  No indication for further ED intervention or evaluation.  Plan refer to patient to rheumatology and vascular surgery for further and more comprehensive evaluation.  CRITICAL CARE-no Performed by: Mancel Bale  Nursing Notes Reviewed/ Care Coordinated Applicable Imaging Reviewed Interpretation of Laboratory Data incorporated into ED treatment  The patient appears reasonably screened and/or stabilized for discharge and I doubt any other medical condition or other [facility] requiring further screening, evaluation, or treatment in the ED at this time prior to discharge.  Plan: Home Medications-continue usual; Home Treatments-try leg elevation elevation; return here if the recommended treatment, does not improve the symptoms; Recommended follow up-PCP, as needed.  Also consider following up with rheumatology and vascular surgery.     Final Clinical Impression(s) / ED Diagnoses Final diagnoses:  Leg swelling    Rx / DC Orders ED Discharge Orders    None       Mancel Bale, MD 07/18/20 1844

## 2020-07-18 NOTE — ED Triage Notes (Signed)
Patient reports that she has had swelling from the right thigh to the right foot since June. Patient states that she was diagnosed with a Baker's cyst diagnosed on 05/07/20. Patient states she has been to the ortho follow up and has been using OTC products. Patient states the swelling has been at it's worse this week.

## 2020-07-23 ENCOUNTER — Telehealth: Payer: Self-pay

## 2020-07-23 NOTE — Telephone Encounter (Signed)
Talked with patient and advised her that her insurance does not cover Durolane and will submit for Gel-One, which is a preferred product through her insurance.  Patient voiced that she understands.  Submitted VOB, Gel-One, right knee.

## 2020-07-25 ENCOUNTER — Other Ambulatory Visit: Payer: Self-pay | Admitting: Obstetrics and Gynecology

## 2020-07-25 DIAGNOSIS — Z1231 Encounter for screening mammogram for malignant neoplasm of breast: Secondary | ICD-10-CM

## 2020-07-29 ENCOUNTER — Telehealth: Payer: Self-pay

## 2020-07-29 NOTE — Telephone Encounter (Signed)
Approved, Gel-One, right knee. Buy & Bill Patient will be responsible for 30% OOP. Co-pay of $40.00 required No PA required  Appt. 07/31/2020

## 2020-07-31 ENCOUNTER — Other Ambulatory Visit: Payer: Self-pay | Admitting: Physician Assistant

## 2020-07-31 ENCOUNTER — Encounter: Payer: Self-pay | Admitting: Orthopaedic Surgery

## 2020-07-31 ENCOUNTER — Ambulatory Visit: Payer: Federal, State, Local not specified - PPO | Admitting: Orthopaedic Surgery

## 2020-07-31 VITALS — Ht 66.0 in | Wt 250.0 lb

## 2020-07-31 DIAGNOSIS — M1711 Unilateral primary osteoarthritis, right knee: Secondary | ICD-10-CM

## 2020-07-31 MED ORDER — TRAMADOL HCL 50 MG PO TABS: 50.0000 mg | ORAL_TABLET | Freq: Two times a day (BID) | ORAL | 0 refills | Status: DC | PRN

## 2020-07-31 MED ORDER — CROSS-LINK HYAL ACID (VISC) 30 MG/3ML IX PRSY
30.0000 mg | PREFILLED_SYRINGE | INTRA_ARTICULAR | Status: AC | PRN
  Administered 2020-07-31: 30 mg via INTRA_ARTICULAR

## 2020-07-31 NOTE — Progress Notes (Signed)
   Procedure Note  Patient: Lindsey Beasley             Date of Birth: 20-Jul-1966           MRN: [id number]             Visit Date: 07/31/2020  Procedures: Visit Diagnoses:  1. Primary osteoarthritis of right knee     Large Joint Inj: R knee on 07/31/2020 8:58 AM Indications: pain Details: 22 G needle  Arthrogram: No  Medications: 30 mg Cross-Linked Hyaluronate 30 MG/3ML Outcome: tolerated well, no immediate complications Patient was prepped and draped in the usual sterile fashion.

## 2020-10-01 ENCOUNTER — Ambulatory Visit
Admission: RE | Admit: 2020-10-01 | Discharge: 2020-10-01 | Disposition: A | Discharge status: Home / Self Care | Payer: Federal, State, Local not specified - PPO | Source: Ambulatory Visit | Attending: Obstetrics and Gynecology | Admitting: Obstetrics and Gynecology

## 2020-10-01 ENCOUNTER — Other Ambulatory Visit: Payer: Self-pay

## 2020-10-01 DIAGNOSIS — Z1231 Encounter for screening mammogram for malignant neoplasm of breast: Secondary | ICD-10-CM

## 2020-10-20 DIAGNOSIS — U071 COVID-19: Secondary | ICD-10-CM | POA: Diagnosis not present

## 2020-11-24 DIAGNOSIS — E785 Hyperlipidemia, unspecified: Secondary | ICD-10-CM | POA: Diagnosis not present

## 2020-11-24 DIAGNOSIS — E1169 Type 2 diabetes mellitus with other specified complication: Secondary | ICD-10-CM | POA: Diagnosis not present

## 2020-11-24 DIAGNOSIS — I10 Essential (primary) hypertension: Secondary | ICD-10-CM | POA: Diagnosis not present

## 2020-12-29 DIAGNOSIS — Z01419 Encounter for gynecological examination (general) (routine) without abnormal findings: Secondary | ICD-10-CM | POA: Diagnosis not present

## 2021-01-23 DIAGNOSIS — E1169 Type 2 diabetes mellitus with other specified complication: Secondary | ICD-10-CM | POA: Diagnosis not present

## 2021-01-23 DIAGNOSIS — Z91038 Other insect allergy status: Secondary | ICD-10-CM | POA: Diagnosis not present

## 2021-01-23 DIAGNOSIS — I10 Essential (primary) hypertension: Secondary | ICD-10-CM | POA: Diagnosis not present

## 2021-01-23 DIAGNOSIS — Z23 Encounter for immunization: Secondary | ICD-10-CM | POA: Diagnosis not present

## 2021-01-23 DIAGNOSIS — Z0000 Encounter for general adult medical examination without abnormal findings: Secondary | ICD-10-CM | POA: Diagnosis not present

## 2021-01-23 DIAGNOSIS — E785 Hyperlipidemia, unspecified: Secondary | ICD-10-CM | POA: Diagnosis not present

## 2021-05-06 DIAGNOSIS — U071 COVID-19: Secondary | ICD-10-CM | POA: Diagnosis not present

## 2021-07-31 DIAGNOSIS — I10 Essential (primary) hypertension: Secondary | ICD-10-CM | POA: Diagnosis not present

## 2021-07-31 DIAGNOSIS — E1169 Type 2 diabetes mellitus with other specified complication: Secondary | ICD-10-CM | POA: Diagnosis not present

## 2021-07-31 DIAGNOSIS — E785 Hyperlipidemia, unspecified: Secondary | ICD-10-CM | POA: Diagnosis not present

## 2021-08-31 ENCOUNTER — Other Ambulatory Visit: Payer: Self-pay | Admitting: Obstetrics and Gynecology

## 2021-08-31 DIAGNOSIS — Z1231 Encounter for screening mammogram for malignant neoplasm of breast: Secondary | ICD-10-CM

## 2021-10-02 ENCOUNTER — Ambulatory Visit
Admission: RE | Admit: 2021-10-02 | Discharge: 2021-10-02 | Disposition: A | Discharge status: Home / Self Care | Payer: Federal, State, Local not specified - PPO | Source: Ambulatory Visit | Attending: Obstetrics and Gynecology | Admitting: Obstetrics and Gynecology

## 2021-10-02 ENCOUNTER — Other Ambulatory Visit: Payer: Self-pay

## 2021-10-02 DIAGNOSIS — Z1231 Encounter for screening mammogram for malignant neoplasm of breast: Secondary | ICD-10-CM

## 2022-01-26 DIAGNOSIS — E1169 Type 2 diabetes mellitus with other specified complication: Secondary | ICD-10-CM | POA: Diagnosis not present

## 2022-01-26 DIAGNOSIS — I10 Essential (primary) hypertension: Secondary | ICD-10-CM | POA: Diagnosis not present

## 2022-01-26 DIAGNOSIS — Z01818 Encounter for other preprocedural examination: Secondary | ICD-10-CM | POA: Diagnosis not present

## 2022-01-29 ENCOUNTER — Other Ambulatory Visit: Payer: Self-pay | Admitting: Surgery

## 2022-01-29 ENCOUNTER — Other Ambulatory Visit (HOSPITAL_COMMUNITY): Payer: Self-pay | Admitting: Surgery

## 2022-01-29 DIAGNOSIS — E669 Obesity, unspecified: Secondary | ICD-10-CM | POA: Diagnosis not present

## 2022-01-29 DIAGNOSIS — I10 Essential (primary) hypertension: Secondary | ICD-10-CM | POA: Diagnosis not present

## 2022-01-29 DIAGNOSIS — E1169 Type 2 diabetes mellitus with other specified complication: Secondary | ICD-10-CM | POA: Diagnosis not present

## 2022-01-29 DIAGNOSIS — Z6841 Body Mass Index (BMI) 40.0 and over, adult: Secondary | ICD-10-CM | POA: Diagnosis not present

## 2022-02-04 DIAGNOSIS — Z23 Encounter for immunization: Secondary | ICD-10-CM | POA: Diagnosis not present

## 2022-02-04 DIAGNOSIS — E785 Hyperlipidemia, unspecified: Secondary | ICD-10-CM | POA: Diagnosis not present

## 2022-02-04 DIAGNOSIS — Z0000 Encounter for general adult medical examination without abnormal findings: Secondary | ICD-10-CM | POA: Diagnosis not present

## 2022-02-04 DIAGNOSIS — E1169 Type 2 diabetes mellitus with other specified complication: Secondary | ICD-10-CM | POA: Diagnosis not present

## 2022-02-04 DIAGNOSIS — I10 Essential (primary) hypertension: Secondary | ICD-10-CM | POA: Diagnosis not present

## 2022-02-08 ENCOUNTER — Ambulatory Visit (HOSPITAL_COMMUNITY)
Admission: RE | Admit: 2022-02-08 | Discharge: 2022-02-08 | Disposition: A | Discharge status: Home / Self Care | Payer: Federal, State, Local not specified - PPO | Source: Ambulatory Visit | Attending: Surgery | Admitting: Surgery

## 2022-02-08 ENCOUNTER — Other Ambulatory Visit: Payer: Self-pay

## 2022-02-08 DIAGNOSIS — Z01818 Encounter for other preprocedural examination: Secondary | ICD-10-CM | POA: Diagnosis not present

## 2022-02-08 DIAGNOSIS — Z6841 Body Mass Index (BMI) 40.0 and over, adult: Secondary | ICD-10-CM | POA: Insufficient documentation

## 2022-02-16 ENCOUNTER — Ambulatory Visit: Payer: Self-pay | Admitting: Surgery

## 2022-02-24 ENCOUNTER — Encounter: Payer: Self-pay | Admitting: Skilled Nursing Facility1

## 2022-02-24 ENCOUNTER — Encounter: Payer: Federal, State, Local not specified - PPO | Attending: Surgery | Admitting: Skilled Nursing Facility1

## 2022-02-24 DIAGNOSIS — E1169 Type 2 diabetes mellitus with other specified complication: Secondary | ICD-10-CM | POA: Insufficient documentation

## 2022-02-24 DIAGNOSIS — I10 Essential (primary) hypertension: Secondary | ICD-10-CM | POA: Diagnosis not present

## 2022-02-24 DIAGNOSIS — Z713 Dietary counseling and surveillance: Secondary | ICD-10-CM | POA: Insufficient documentation

## 2022-02-24 DIAGNOSIS — Z6841 Body Mass Index (BMI) 40.0 and over, adult: Secondary | ICD-10-CM | POA: Insufficient documentation

## 2022-02-24 DIAGNOSIS — E669 Obesity, unspecified: Secondary | ICD-10-CM

## 2022-02-24 NOTE — Progress Notes (Signed)
Nutrition Assessment for Bariatric Surgery ?Medical Nutrition Therapy ?Appt Start Time: 7:30    End Time: 8:30 ? ?Patient was seen on 02/24/2022 for Pre-Operative Nutrition Assessment. Letter of approval faxed to [facility] bariatric surgery program coordinator on 02/24/2022.  ? ?Referral stated Supervised Weight Loss (SWL) visits needed: 3 ? ?Planned surgery: RYGB ?Pt expectation of surgery: to lose weight ?Pt expectation of dietitian: to help educate  ? ?  ?NUTRITION ASSESSMENT ?  ?Anthropometrics  ?Start weight at [facility]: 263 lbs (date: 02/24/2022)  ?Height: 66 in ?BMI: 42.45 kg/m2   ?  ?Clinical  ?Medical hx: DM, HTN ?Medications: glimepiride, berberine, omega 3, vitamin B6, rybelsus, atorvastatin, amlodipine, bystolic   ?Labs: A1C 10.3, glucose 266, creatinine .52 ?Notable signs/symptoms: N/A ?Any previous deficiencies? No ? ?Micronutrient Nutrition Focused Physical Exam: ?Hair: No issues observed ?Eyes: No issues observed ?Mouth: No issues observed ?Neck: No issues observed ?Nails: No issues observed ?Skin: No issues observed ? ?Lifestyle & Dietary Hx ? ?Pt states she wants to be a weight that is heathy for her frame and feels right with wiggle room.  ?Pt states in her minds eye she pictures herself having a different lifestyle and more active a year from now. ?Pt states she does not check her blood sugar because her doctor does not advised she check her blood sugar (pt later stated she was in denial about her diabetes). Pt states she does not like the idea of pricking her finger.  Dietitian educated patient on continuous monitoring devices.  Pt sates she has had diabetes for about 4 years.  ?Pt states she stopped drinking alcohol this year stating she thought that would have decreased her A1C stating she did replace alcohol with candy. ?Pt state she does work from home. ?Pt states she lives alone ?Pt states if she eats too many tomatoes she will get hives.  ?Pt states she wants to get back to  walking.  ?Pt does recognize she snacks a lot.  ?Pt states she tries not to eat after 7 pm.  ?Pt states her breaks at work involve food. ? ?24-Hr Dietary Recall: once a week: carrot and beet juice ?First Meal: yogurt or cottage cheese + fruit or smoothie: fruit and veggies ?Snack: pineapple ?Second Meal: kale and mushrooms sometimes with an egg + wheat toast ?Snack: cheese and crackers ?Snack: chips ?Snack: chocolate ?Third Meal: beef, rice, salad or chicken + rice ?Snack: chips or popcorn ?Beverages: water + lemon, herbal tea, coffee, beet/carrot juice ?  ?Estimated Energy Needs ?Calories: 1500 ? ? ?NUTRITION DIAGNOSIS  ?Overweight/obesity ([place]-3.3) related to past poor dietary habits and physical inactivity as evidenced by patient w/ planned RYGB surgery following dietary guidelines for continued weight loss. ?  ? ?NUTRITION INTERVENTION  ?Nutrition counseling (C-1) and education (E-2) to facilitate bariatric surgery goals. ? ?Educated pt on micronutrient deficiencies post surgery and strategies to mitigate that risk ? ?Educated patient on importance of checking blood sugars, and options for monitors. ?Discussed target Blood Glucose ranges pre and post meals and A1C ?Counseling and diabetes education initiated ?Benefits of increased activity ? ?  ?Pre-Op Goals Reviewed with the Patient ?Track food and beverage intake (pen and paper, MyFitness Pal, Baritastic app, etc.) ?Make healthy food choices while monitoring portion sizes ?Consume 3 meals per day or try to eat every 3-5 hours ?Avoid concentrated sugars and fried foods ?Keep sugar & fat in the single digits per serving on food labels ?Practice CHEWING your food (aim for applesauce consistency) ?Practice not  drinking 15 minutes before, during, and 30 minutes after each meal and snack ?Avoid all carbonated beverages (ex: soda, sparkling beverages)  ?Limit caffeinated beverages (ex: coffee, tea, energy drinks) ?Avoid all sugar-sweetened beverages (ex: regular soda,  sports drinks)  ?Avoid alcohol  ?Aim for 64-100 ounces of FLUID daily (with at least half of fluid intake being plain water)  ?Aim for at least 60-80 grams of PROTEIN daily ?Look for a liquid protein source that contains ?15 g protein and ?5 g carbohydrate (ex: shakes, drinks, shots) ?Make a list of non-food related activities ?Physical activity is an important part of a healthy lifestyle so keep it moving! The goal is to reach 150 minutes of exercise per week, including cardiovascular and weight baring activity. ?Check blood sugars 1-2 times a day, and changing up what time of day. ? ?*Goals that are bolded indicate the pt would like to start working towards these ? ?Handouts Provided Include  ?Bariatric Surgery handouts (Nutrition Visits, Pre-Op Goals, Protein Shakes, Vitamins & Minerals) ? ?Learning Style & Readiness for Change ?Teaching method utilized: Visual & Auditory  ?Demonstrated degree of understanding via: Teach Back  ?Readiness Level: Ready ?Barriers to learning/adherence to lifestyle change: none identified ? ?RD's Notes for Next Visit ?Patients next SWL appointment. ?  ?  ?MONITORING & EVALUATION ?Dietary intake, weekly physical activity, body weight, and pre-op goals reached at next nutrition visit.  ?  ?Next Steps  ?Patient is to follow up at [facility] for Pre-Op Class >2 weeks before surgery for further nutrition education.  ?

## 2022-03-05 DIAGNOSIS — E1169 Type 2 diabetes mellitus with other specified complication: Secondary | ICD-10-CM | POA: Diagnosis not present

## 2022-03-05 DIAGNOSIS — Z6841 Body Mass Index (BMI) 40.0 and over, adult: Secondary | ICD-10-CM | POA: Diagnosis not present

## 2022-03-05 DIAGNOSIS — I10 Essential (primary) hypertension: Secondary | ICD-10-CM | POA: Diagnosis not present

## 2022-03-09 ENCOUNTER — Ambulatory Visit (HOSPITAL_BASED_OUTPATIENT_CLINIC_OR_DEPARTMENT_OTHER): Payer: Federal, State, Local not specified - PPO | Admitting: Physical Therapy

## 2022-03-10 ENCOUNTER — Ambulatory Visit: Payer: Federal, State, Local not specified - PPO | Admitting: Skilled Nursing Facility1

## 2022-03-15 ENCOUNTER — Ambulatory Visit (HOSPITAL_COMMUNITY): Payer: Self-pay | Admitting: Licensed Clinical Social Worker

## 2022-04-01 ENCOUNTER — Ambulatory Visit (HOSPITAL_BASED_OUTPATIENT_CLINIC_OR_DEPARTMENT_OTHER): Payer: Federal, State, Local not specified - PPO | Admitting: Physical Therapy

## 2022-04-06 ENCOUNTER — Encounter (HOSPITAL_BASED_OUTPATIENT_CLINIC_OR_DEPARTMENT_OTHER): Payer: Self-pay | Admitting: Physical Therapy

## 2022-04-06 ENCOUNTER — Ambulatory Visit (HOSPITAL_BASED_OUTPATIENT_CLINIC_OR_DEPARTMENT_OTHER): Payer: Federal, State, Local not specified - PPO | Attending: Surgery | Admitting: Physical Therapy

## 2022-04-06 DIAGNOSIS — I10 Essential (primary) hypertension: Secondary | ICD-10-CM | POA: Insufficient documentation

## 2022-04-06 DIAGNOSIS — G8929 Other chronic pain: Secondary | ICD-10-CM | POA: Insufficient documentation

## 2022-04-06 DIAGNOSIS — Z6841 Body Mass Index (BMI) 40.0 and over, adult: Secondary | ICD-10-CM | POA: Diagnosis not present

## 2022-04-06 DIAGNOSIS — M25511 Pain in right shoulder: Secondary | ICD-10-CM | POA: Insufficient documentation

## 2022-04-06 DIAGNOSIS — E1169 Type 2 diabetes mellitus with other specified complication: Secondary | ICD-10-CM | POA: Insufficient documentation

## 2022-04-06 DIAGNOSIS — M6281 Muscle weakness (generalized): Secondary | ICD-10-CM | POA: Diagnosis not present

## 2022-04-06 NOTE — Therapy (Addendum)
OUTPATIENT PHYSICAL THERAPY THORACOLUMBAR EVALUATION   Patient Name: Lindsey Beasley MRN: [id number] DOB:July 12, 1966, 56 y.o., female Today's Date: 04/06/2022   PT End of Session - 04/06/22 0844     Visit Number 1    Number of Visits 3    Date for PT Re-Evaluation 06/04/22    Authorization Type BCBS    PT Start Time 0800    PT Stop Time 0843    PT Time Calculation (min) 43 min    Activity Tolerance Patient tolerated treatment well    Behavior During Therapy WFL for tasks assessed/performed             Past Medical History:  Diagnosis Date   Baker's cyst    Diabetes mellitus without complication ([place])    Hypertension    Past Surgical History:  Procedure Laterality Date   BREAST BIOPSY Left    CESAREAN SECTION  1985, 1989   There are no problems to display for this patient.   PCP: Kathyrn Lass, MD  REFERRING PROVIDER: Stechschulte, Nickola Major, MD  REFERRING DIAG:  [phone] (ICD-10-CM) - Morbid (severe) obesity due to excess calories  Z68.41 (ICD-10-CM) - Body mass index (BMI) 40.0-44.9, adult  E11.69 (ICD-10-CM) - Type 2 diabetes mellitus with other specified complication  X91.4 (NWG-95-AO) - Obesity, unspecified  Z01.818 (ICD-10-CM) - Encounter for other preprocedural examination    Rationale for Evaluation and Treatment Rehabilitation  THERAPY DIAG:  Chronic right shoulder pain  Muscle weakness (generalized)  ONSET DATE: in the last year or so  SUBJECTIVE:                                                                                                                                                                                           SUBJECTIVE STATEMENT: The biggest concrn right now is a past shoulder fx about 10 years ago- came back in the last year or so. A nag that is constant. The right knee flared up about 2 years ago. Had cortisone and gel injections but did not like the effects. It is not doing the same swelling anymore and doesn't prohibit  me from doing anything. Shoulder keeps me from sleeping.   PERTINENT HISTORY:  H/o fall with shoulder Fx  PAIN:  Are you having pain? Yes: NPRS scale: 4-5/10 Pain location: right shoulder Pain description: dull Aggravating factors: worse early in the day Relieving factors: movement to stretch it out   PRECAUTIONS: None  WEIGHT BEARING RESTRICTIONS No  FALLS:  Has patient fallen in last 6 months? No  LIVING ENVIRONMENT: Lives with: lives alone Lives in: House/apartment   OCCUPATION: HR for [employer]  PLOF: Independent  PATIENT GOALS decrease right shoulder pain, strengthen LEs   OBJECTIVE:   PATIENT SURVEYS:  FOTO 52   COGNITION:  Overall cognitive status: Within functional limits for tasks assessed     SENSATION: WFL  POSTURE: rounded shoulders and increased thoracic kyphosis  PALPATION: Tightness in Rt upper trap  Shoulder ROM: Able to demo full AROM with notable elevation and scapular winging on Right side   EXTREMITY MMT:    MMT Right eval Left eval  shoulder flexion 22.5 lb 33.2  Shoulder extension    Shoulder abduction 24.7 35.1  Shoulder adduction    internal rotation 20.7 21.7  external rotation 17.1 25.5  Knee flexion    Knee extension 55.6 59.3   (Blank rows = not tested)    GAIT: WFL    TODAY'S TREATMENT  EVAL - Seated Scapular Retraction  - 7 x weekly - Shoulder External Rotation and Scapular Retraction with Resistance  - 3 x daily - 7 x weekly - 2 sets - 10 reps - Doorway Pec Stretch at 90 Degrees Abduction  - 3 x daily - 7 x weekly - 2 sets - 3 breaths hold - Flexion-Extension Shoulder Pendulum with Table Support  - 1 x daily - 7 x weekly - 3 sets - 10 reps - Standing 'L' Stretch at Counter  - 1 x daily - 7 x weekly - 3 sets - 10 reps - Seated Gentle Upper Trapezius Stretch  - 2 x daily - 7 x weekly - 2 sets - 3 breaths hold - Gentle Levator Scapulae Stretch  - 2 x daily - 7 x weekly - 2 sets - 3 breaths  hold   PATIENT EDUCATION:  Education details: Anatomy of condition, POC, HEP, exercise form/rationale, work Dentist educated: Patient Education method: Consulting civil engineer, Media planner, Corporate treasurer cues, Verbal cues, and Handouts Education comprehension: verbalized understanding, returned demonstration, verbal cues required, tactile cues required, and needs further education   HOME EXERCISE PROGRAM: [id number]  ASSESSMENT:  CLINICAL IMPRESSION: Patient is a 56 y.o. F who was seen today for physical therapy evaluation and treatment to address shoulder pain and improve mobility prior to bariatric surgery. Her right shoulder is significantly weaker than the Lt which could lead to issues in UE use post op that will be necessary due to limitations. 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  Will f/u in 4 weeks to progress strength training after she works to incorporate changes into her day and work on current stretches   [place] decreased activity tolerance, decreased strength, increased muscle spasms, impaired UE functional use, postural dysfunction, obesity, and pain.   ACTIVITY LIMITATIONS carrying, lifting, and sleeping  PARTICIPATION LIMITATIONS: meal prep, cleaning, and occupation  PERSONAL FACTORS 1 comorbidity: chronic nature  are also affecting patient's functional outcome.   REHAB POTENTIAL: Good  CLINICAL DECISION MAKING: Stable/uncomplicated  EVALUATION COMPLEXITY: Low   GOALS: Goals reviewed with patient? Yes    LONG TERM GOALS:   Pt will have made corrections to workstation and incorporated frequent mobility into her work day Baseline:  Goal status: INITIAL  Target date: 05/04/2022  2.  Able to demo proper form in stretching Baseline:  Goal status: INITIAL  Target date: 05/04/2022  3.  Rt UE strength to 90% of left Baseline:  Goal status: INITIAL  Target date: 06/01/2022  4.  FOTO to meet stated goal Baseline:   Goal status: INITIAL  Target date: 06/01/2022    PLAN: PT FREQUENCY:  1x/month  PT DURATION: 8 weeks, for financial support  PLANNED INTERVENTIONS: Therapeutic exercises, Therapeutic activity, Neuromuscular re-education, Patient/Family education, Joint mobilization, Dry Needling, Electrical stimulation, Spinal mobilization, Cryotherapy, Moist heat, Taping, Ionotophoresis 59m/ml Dexamethasone, Manual therapy, and Re-evaluation.  PLAN FOR NEXT SESSION: add shoulder strengthening exercises   Lourine Alberico C. Luverna Degenhart PT, DPT 04/06/22 8:55 AM  PHYSICAL THERAPY DISCHARGE SUMMARY  Visits from Start of Care: 1  Current functional level related to goals / functional outcomes: See above   Remaining deficits: See above   Education / Equipment: Anatomy of condition, POC, HEP, exercise form/rationale    Patient agrees to discharge. Patient goals were not met. Patient is being discharged due to not returning since the last visit.

## 2022-04-08 ENCOUNTER — Ambulatory Visit: Payer: Federal, State, Local not specified - PPO | Admitting: Skilled Nursing Facility1

## 2022-04-14 DIAGNOSIS — J209 Acute bronchitis, unspecified: Secondary | ICD-10-CM | POA: Diagnosis not present

## 2022-04-14 DIAGNOSIS — H6693 Otitis media, unspecified, bilateral: Secondary | ICD-10-CM | POA: Diagnosis not present

## 2022-04-19 ENCOUNTER — Ambulatory Visit (INDEPENDENT_AMBULATORY_CARE_PROVIDER_SITE_OTHER): Payer: Self-pay | Admitting: Licensed Clinical Social Worker

## 2022-04-19 DIAGNOSIS — F4323 Adjustment disorder with mixed anxiety and depressed mood: Secondary | ICD-10-CM
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
## 2022-04-22 ENCOUNTER — Encounter (HOSPITAL_COMMUNITY): Payer: Self-pay | Admitting: Surgery

## 2022-04-26 ENCOUNTER — Encounter: Payer: Self-pay | Admitting: Dietician

## 2022-04-26 ENCOUNTER — Encounter: Payer: Federal, State, Local not specified - PPO | Attending: Surgery | Admitting: Dietician

## 2022-04-26 DIAGNOSIS — E669 Obesity, unspecified: Secondary | ICD-10-CM | POA: Diagnosis not present

## 2022-04-26 NOTE — Progress Notes (Signed)
Supervised Weight Loss Visit Bariatric Nutrition Education Appt Start Time: 4:05    End Time: 4:40  Planned Surgery: RYGB   1 out of 3 SWL Appointments   NUTRITION ASSESSMENT  Anthropometrics  Start weight at [facility]: 263 lbs (date: 02/24/2022)  Height: 66 in Weight today: 261.5 lbs BMI: 42.45 kg/m2     Clinical  Medical hx: DM, HTN Medications: glimepiride, berberine, omega 3, vitamin B6, rybelsus, atorvastatin, amlodipine, bystolic   Labs: R1H 10.3, glucose 266, creatinine .52 Notable signs/symptoms: N/A Any previous deficiencies? No  Lifestyle & Dietary Hx Pt states he PCP started her on 3 mg Rybelsus and that has been increased to  is now on 7 mg of Rybelsus since her last nutrition visit. Pt states she is getting labs drawn prior to her appointment with PCP on the 3rd of July. Pt states she starting checking her blood sugars after last appointment.  Pt states she is more mindful of what she is eating now, stating the immediate feedback has helped. Pt states the light bulb is going off now, with checking her blood sugars.  Blood sugars fasting from April are 242, 239, 230, 203, 213, 272, 190 Pt states her mother passed and things have been crazy, so she hasn't been checking her blood sugars lately.  Pt states she is craving caffeine, stating she drinks two cups of coffee a day. Pt states she stopped drinking alcohol starting this past January. Pt states she is still remote for work at home. Pt states she has been working on chewing well and not drinking with meal. Pt states she has been trying to have a smoothie in the morning.    Estimated daily fluid intake: 64 oz Supplements: B12 Current average weekly physical activity: Pt states she is trying to do a mile twice a day (2 - 15 minute increments)  24-Hr Dietary Recall First Meal: water, coffee Snack:  Second Meal: 2 eggs with cheese and wheat toast Snack: hard cheese pretzels Third Meal: trout or salmon, broccoli,  couscous Snack: popcorn or nuts (almond or pistachios) Beverages: water, coffee, beet juice, carrot juice   NUTRITION DIAGNOSIS  Overweight/obesity ([place]-3.3) related to past poor dietary habits and physical inactivity as evidenced by patient w/ planned RYGB surgery following dietary guidelines for continued weight loss.   NUTRITION INTERVENTION  Nutrition counseling (C-1) and education (E-2) to facilitate bariatric surgery goals.  Why you need complex carbohydrates: Whole grains and other complex carbohydrates are required to have a healthy diet. Whole grains provide fiber which can help with blood glucose levels and help keep you satiated. Fruits and starchy vegetables provide essential vitamins and minerals required for immune function, eyesight support, brain support, bone density, wound healing and many other functions within the body. According to the current evidenced based 2020-2025 Dietary Guidelines for Americans, complex carbohydrates are part of a healthy eating pattern which is associated with a decreased risk for type 2 diabetes, cancers, and cardiovascular disease.   Pre-Op Goals Progress & New Goals Continue:  Check blood sugars 1-2 times a day, and changing up what time of day. New:  Consume 3 meals per day or try to eat every 3-5 hours  Handouts Provided Include  Reviewed Pre-op goals sheet patient brought in  Learning Style & Readiness for Change Teaching method utilized: Visual & Auditory  Demonstrated degree of understanding via: Teach Back  Readiness Level: Preparation Barriers to learning/adherence to lifestyle change: mother passing away  RD's Notes for next Visit  Progress toward  patient chosen goals Blood sugars and A1C   MONITORING & EVALUATION Dietary intake, weekly physical activity, body weight, and pre-op goals in 1 month.   Next Steps  Patient is to return to [facility] in one month for next SWL visit.

## 2022-05-04 ENCOUNTER — Other Ambulatory Visit: Payer: Self-pay

## 2022-05-04 ENCOUNTER — Ambulatory Visit (HOSPITAL_COMMUNITY): Payer: Federal, State, Local not specified - PPO | Admitting: Certified Registered Nurse Anesthetist

## 2022-05-04 ENCOUNTER — Ambulatory Visit (HOSPITAL_COMMUNITY)
Admission: RE | Admit: 2022-05-04 | Discharge: 2022-05-04 | Disposition: A | Discharge status: Home / Self Care | Payer: Federal, State, Local not specified - PPO | Attending: Surgery | Admitting: Surgery

## 2022-05-04 ENCOUNTER — Encounter (HOSPITAL_COMMUNITY)
Admission: RE | Disposition: A | Discharge status: Home / Self Care | Payer: Self-pay | Source: Home / Self Care | Attending: Surgery

## 2022-05-04 DIAGNOSIS — Z01818 Encounter for other preprocedural examination: Secondary | ICD-10-CM | POA: Diagnosis not present

## 2022-05-04 DIAGNOSIS — Z6841 Body Mass Index (BMI) 40.0 and over, adult: Secondary | ICD-10-CM | POA: Diagnosis not present

## 2022-05-04 DIAGNOSIS — K2950 Unspecified chronic gastritis without bleeding: Secondary | ICD-10-CM | POA: Insufficient documentation

## 2022-05-04 DIAGNOSIS — B9681 Helicobacter pylori [H. pylori] as the cause of diseases classified elsewhere: Secondary | ICD-10-CM | POA: Diagnosis not present

## 2022-05-04 DIAGNOSIS — Z87891 Personal history of nicotine dependence: Secondary | ICD-10-CM | POA: Diagnosis not present

## 2022-05-04 DIAGNOSIS — K5710 Diverticulosis of small intestine without perforation or abscess without bleeding: Secondary | ICD-10-CM | POA: Diagnosis not present

## 2022-05-04 DIAGNOSIS — K449 Diaphragmatic hernia without obstruction or gangrene: Secondary | ICD-10-CM | POA: Diagnosis not present

## 2022-05-04 DIAGNOSIS — I10 Essential (primary) hypertension: Secondary | ICD-10-CM | POA: Diagnosis not present

## 2022-05-04 DIAGNOSIS — K2970 Gastritis, unspecified, without bleeding: Secondary | ICD-10-CM | POA: Diagnosis not present

## 2022-05-04 HISTORY — PX: BIOPSY: SHX5522

## 2022-05-04 HISTORY — PX: ESOPHAGOGASTRODUODENOSCOPY: SHX5428

## 2022-05-04 LAB — GLUCOSE, CAPILLARY
Glucose-Capillary: 118 mg/dL — ABNORMAL HIGH (ref 70–99)
Glucose-Capillary: 141 mg/dL — ABNORMAL HIGH (ref 70–99)

## 2022-05-04 SURGERY — EGD (ESOPHAGOGASTRODUODENOSCOPY)
Anesthesia: Monitor Anesthesia Care

## 2022-05-04 MED ORDER — LIDOCAINE 2% (20 MG/ML) 5 ML SYRINGE
INTRAMUSCULAR | Status: DC | PRN
  Administered 2022-05-04: 100 mg via INTRAVENOUS

## 2022-05-04 MED ORDER — PROPOFOL 10 MG/ML IV BOLUS
INTRAVENOUS | Status: DC | PRN
  Administered 2022-05-04: 40 mg via INTRAVENOUS

## 2022-05-04 MED ORDER — LACTATED RINGERS IV SOLN: INTRAVENOUS | Status: DC

## 2022-05-04 MED ORDER — PROPOFOL 500 MG/50ML IV EMUL
INTRAVENOUS | Status: DC | PRN
  Administered 2022-05-04: 125 ug/kg/min via INTRAVENOUS

## 2022-05-04 MED ORDER — ONDANSETRON HCL 4 MG/2ML IJ SOLN
INTRAMUSCULAR | Status: DC | PRN
  Administered 2022-05-04: 4 mg via INTRAVENOUS

## 2022-05-04 MED ORDER — PROPOFOL 500 MG/50ML IV EMUL
INTRAVENOUS | Status: AC
  Filled 2022-05-04: qty 50

## 2022-05-04 NOTE — Anesthesia Preprocedure Evaluation (Addendum)
Anesthesia Evaluation  Patient identified by MRN, date of birth, ID band Patient awake    Reviewed: Allergy & Precautions, NPO status , Patient's Chart, lab work & pertinent test results  Airway Mallampati: III  TM Distance: >3 FB Neck ROM: Full    Dental no notable dental hx. (+) Teeth Intact, Dental Advisory Given   Pulmonary former smoker,    Pulmonary exam normal breath sounds clear to auscultation       Cardiovascular hypertension, Normal cardiovascular exam Rhythm:Regular Rate:Normal     Neuro/Psych    GI/Hepatic negative GI ROS, Neg liver ROS,   Endo/Other  diabetesMorbid obesity  Renal/GU      Musculoskeletal   Abdominal   Peds  Hematology   Anesthesia Other Findings   Reproductive/Obstetrics                           Anesthesia Physical Anesthesia Plan  ASA: 3  Anesthesia Plan: MAC   Post-op Pain Management:    Induction: Intravenous  PONV Risk Score and Plan:   Airway Management Planned: Nasal Cannula and Natural Airway  Additional Equipment: None  Intra-op Plan:   Post-operative Plan:   Informed Consent: I have reviewed the patients History and Physical, chart, labs and discussed the procedure including the risks, benefits and alternatives for the proposed anesthesia with the patient or authorized representative who has indicated his/her understanding and acceptance.     Dental advisory given  Plan Discussed with:   Anesthesia Plan Comments: (EGD for GERD and Morbid obesity)       Anesthesia Quick Evaluation

## 2022-05-04 NOTE — H&P (Signed)
Admitting Physician: Hyman Hopes Stephnie Parlier  Service: Bariatric surgery  CC: obesity  Subjective   HPI: Lindsey Beasley is an 56 y.o. female who is here for EGD prior to batriatric surgery  Past Medical History:  Diagnosis Date   Baker's cyst    Diabetes mellitus without complication (HCC)    Hypertension     Past Surgical History:  Procedure Laterality Date   BREAST BIOPSY Left    CESAREAN SECTION  1985, 1989    Family History  Problem Relation Age of Onset   Diabetes Mother    Kidney failure Mother    High blood pressure Mother    Parkinson's disease Father    Breast cancer Cousin     Social:  reports that she quit smoking about 6 years ago. Her smoking use included cigarettes. She has never used smokeless tobacco. She reports current alcohol use. She reports that she does not use drugs.  Allergies:  Allergies  Allergen Reactions   Bee Venom Anaphylaxis   Lisinopril Cough   Penicillins     Unknown Reaction - family reaction (mother)    Medications: Current Outpatient Medications  Medication Instructions   amLODipine (NORVASC) 10 mg, Oral, Daily   atorvastatin (LIPITOR) 20 mg, Oral, Daily   cetirizine (ZYRTEC) 10 mg, Oral, Daily PRN   EPINEPHrine (EPIPEN 2-PAK) 0.3 mg, Intramuscular, As needed, Use as directed for life-threatening allergic reaction.    Fish Oil 2,000 mg, Oral, Daily   loratadine (CLARITIN) 10 mg, Oral, Daily PRN   losartan (COZAAR) 100 mg, Oral, Daily   Naphazoline-Pheniramine (EQ EYE ALLERGY RELIEF) 0.027-0.315 % SOLN 1 drop, Both Eyes, 2 times daily PRN   nebivolol (BYSTOLIC) 10 mg, Oral, Daily   Rybelsus 7 mg, Oral, Daily   sodium chloride (OCEAN) 0.65 % SOLN nasal spray 1 spray, Each Nare, As needed    ROS - all of the below systems have been reviewed with the patient and positives are indicated with bold text General: chills, fever or night sweats Eyes: blurry vision or double vision ENT: epistaxis or sore  throat Allergy/Immunology: itchy/watery eyes or nasal congestion Hematologic/Lymphatic: bleeding problems, blood clots or swollen lymph nodes Endocrine: temperature intolerance or unexpected weight changes Breast: new or changing breast lumps or nipple discharge Resp: cough, shortness of breath, or wheezing CV: chest pain or dyspnea on exertion GI: as per HPI GU: dysuria, trouble voiding, or hematuria MSK: joint pain or joint stiffness Neuro: TIA or stroke symptoms Derm: pruritus and skin lesion changes Psych: anxiety and depression  Objective   PE There were no vitals taken for this visit. Constitutional: NAD; conversant; no deformities Eyes: Moist conjunctiva; no lid lag; anicteric; PERRL Neck: Trachea midline; no thyromegaly Lungs: Normal respiratory effort; no tactile fremitus CV: RRR; no palpable thrills; no pitting edema GI: Abd Soft; no palpable hepatosplenomegaly MSK: Normal range of motion of extremities; no clubbing/cyanosis Psychiatric: Appropriate affect; alert and oriented x3 Lymphatic: No palpable cervical or axillary lymphadenopathy  No results found for this or any previous visit (from the past 24 hour(s)).  Imaging Orders  No imaging studies ordered today     Assessment and Plan  Lindsey Beasley is a 56 y.o. female who is seen for initial bariatric surgery consultation. The patient has morbid obesity with a BMI of Body mass index of 43.13 kg/m and the following conditions related to obesity: Type 2 diabetes, hypertension.  We discussed the surgical options to treat obesity and its associated comorbidity. After discussing the available procedures  in the region, we discussed in great detail the surgeries I offer: robotic sleeve gastrectomy and robotic roux-en-y gastric bypass. We discussed the procedures themselves as well as their risks, benefits and alternatives. I entered the patient's basic information into the [facility] Metabolic Surgery Risk/Benefit  Calculator to facilitate this discussion. I also entered the patient's information into the [facility] Individualized Metabolic Surgery Score calculator to help explain surgery's effect on Type 2 Diabetes.   After a full discussion and all questions answered, the patient is interested in pursuing a robotic gastric bypass.   We will initiate the bariatric surgery preoperative pathway to include the following: - Bloodwork - Dietician consult - Working with Marolyn Haller, RD - Chest x-ray 02/08/22 - No active cardiopulmonary disease. - EKG 02/08/22 - NSR - Psychology evaluation - Physical therapy - Working with Army Fossa, PT - Upper endoscopy with biopsy. I explained my rational for performing upper endoscopy in my bariatric patients. During the procedure I will biopsy for H. Pylori, evaluate for hiatal hernia, evaluate for reflux esophagitis and look for any other abnormalities that may influence the procedure. We discussed the risks, benefits and alternative to this procedure and the patient granted consent to proceed.  Today she presents for EGD.  We will proceed as scheduled.    Quentin Ore, MD  [facility] Use [email or web address] to contact on call provider

## 2022-05-04 NOTE — Anesthesia Procedure Notes (Signed)
Procedure Name: MAC Date/Time: 05/04/2022 1:13 PM  Performed by: Maxwell Caul, CRNAPre-anesthesia Checklist: Patient identified, Emergency Drugs available, Suction available and Patient being monitored Oxygen Delivery Method: Simple face mask

## 2022-05-04 NOTE — Op Note (Signed)
[facility] Patient Name: Lindsey Beasley Procedure Date: 05/04/2022 MRN: [id number] Attending MD: Felicie Morn ,  Date of Birth: [birth year] CSN: [id number] Age: 56 Admit Type: Outpatient Procedure:                Upper GI endoscopy Indications:               Providers:                Felicie Morn, Ladonna Snide, Technician Referring MD:              Medicines:                Monitored Anesthesia Care Complications:            No immediate complications. Estimated Blood Loss:     Estimated blood loss was minimal. Procedure:                Pre-Anesthesia Assessment:                           - Prior to the procedure, a History and Physical                            was performed, and patient medications and                            allergies were reviewed. The patient is competent.                            The risks and benefits of the procedure and the                            sedation options and risks were discussed with the                            patient. All questions were answered and informed                            consent was obtained. Patient identification and                            proposed procedure were verified by the physician                            in the pre-procedure area. Mental Status                            Examination: alert and oriented. Airway                            Examination: normal oropharyngeal airway and neck                            mobility. Respiratory Examination: clear to  auscultation. CV Examination: normal. ASA Grade                            Assessment: II - A patient with mild systemic                            disease. After reviewing the risks and benefits,                            the patient was deemed in satisfactory condition to                            undergo the procedure. The anesthesia plan was  to                            use monitored anesthesia care (MAC). Immediately                            prior to administration of medications, the patient                            was re-assessed for adequacy to receive sedatives.                            The heart rate, respiratory rate, oxygen                            saturations, blood pressure, adequacy of pulmonary                            ventilation, and response to care were monitored                            throughout the procedure. The physical status of                            the patient was re-assessed after the procedure.                           After obtaining informed consent, the endoscope was                            passed under direct vision. Throughout the                            procedure, the patient's blood pressure, pulse, and                            oxygen saturations were monitored continuously. The                            [id number]-H190 ([id number] Olympus endoscope was introduced  through the mouth, and advanced to the second part                            of duodenum. The upper GI endoscopy was                            accomplished without difficulty. The patient                            tolerated the procedure well. Scope In: Scope Out: Findings:      The gastroesophageal flap valve was visualized endoscopically and       classified as Hill Grade IV (no fold, wide open lumen, hiatal hernia       present).      A medium-sized hiatal hernia was present.      The Z-line was regular.      Localized mild inflammation characterized by congestion (edema) was       found in the prepyloric region of the stomach. Biopsies were taken with       a cold forceps for histology and h. pylori. Verification of patient       identification for the specimen was done. Estimated blood loss was       minimal.      A 7 mm non-bleeding diverticulum was found in the second  portion of the       duodenum. 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                           - Medium-sized hiatal hernia.                           - Z-line regular.                           - Gastritis. Biopsied.                           - Non-bleeding duodenal diverticulum. Moderate Sedation:      Moderate (conscious) sedation was personally administered by an       anesthesia professional. The following parameters were monitored: oxygen       saturation, heart rate, blood pressure, and response to care. Total       physician intraservice time was 15 minutes. Recommendation:           - Discharge patient to home.                           - Resume previous diet.                           -  Continue present medications.                           - Await pathology results. Procedure Code(s):        --- Professional ---                           [id number], Esophagogastroduodenoscopy, flexible,                            transoral; with biopsy, single or multiple Diagnosis Code(s):        --- Professional ---                           K44.9, Diaphragmatic hernia without obstruction or                            gangrene                           K29.70, Gastritis, unspecified, without bleeding                           K57.10, Diverticulosis of small intestine without                            perforation or abscess without bleeding CPT copyright 2019 [employer] Medical Association. All rights reserved. The codes documented in this report are preliminary and upon coder review may  be revised to meet current compliance requirements. Petronila,  05/04/2022 1:34:38 PM This report has been signed electronically. Number of Addenda: 0

## 2022-05-04 NOTE — Transfer of Care (Signed)
Immediate Anesthesia Transfer of Care Note  Patient: Lindsey Beasley  Procedure(s) Performed: ESOPHAGOGASTRODUODENOSCOPY (EGD) BIOPSY  Patient Location: PACU and Endoscopy Unit  Anesthesia Type:MAC  Level of Consciousness: awake, alert  and oriented  Airway & Oxygen Therapy: Patient Spontanous Breathing and Patient connected to face mask oxygen  Post-op Assessment: Report given to RN and Post -op Vital signs reviewed and stable  Post vital signs: Reviewed and stable  Last Vitals:  Vitals Value Taken Time  BP 115/56 05/04/22 1331  Temp    Pulse 80 05/04/22 1334  Resp 19 05/04/22 1334  SpO2 98 % 05/04/22 1334  Vitals shown include unvalidated device data.  Last Pain: There were no vitals filed for this visit.       Complications: No notable events documented.

## 2022-05-05 ENCOUNTER — Encounter (HOSPITAL_COMMUNITY): Payer: Self-pay | Admitting: Surgery

## 2022-05-05 LAB — SURGICAL PATHOLOGY

## 2022-05-05 NOTE — Anesthesia Postprocedure Evaluation (Signed)
Anesthesia Post Note  Patient: Lindsey Beasley  Procedure(s) Performed: ESOPHAGOGASTRODUODENOSCOPY (EGD) BIOPSY     Patient location during evaluation: Endoscopy Anesthesia Type: MAC Level of consciousness: awake and alert Pain management: pain level controlled Vital Signs Assessment: post-procedure vital signs reviewed and stable Respiratory status: spontaneous breathing, nonlabored ventilation, respiratory function stable and patient connected to nasal cannula oxygen Cardiovascular status: blood pressure returned to baseline and stable Postop Assessment: no apparent nausea or vomiting Anesthetic complications: no   No notable events documented.  Last Vitals:  Vitals:   05/04/22 1350 05/04/22 1400  BP: (!) 167/68 (!) 172/82  Pulse: 77 76  Resp: 17 17  Temp:    SpO2: 98% 95%    Last Pain:  Vitals:   05/04/22 1400  TempSrc:   PainSc: 0-No pain   Pain Goal:                   Barnet [place]

## 2022-05-07 ENCOUNTER — Encounter (HOSPITAL_BASED_OUTPATIENT_CLINIC_OR_DEPARTMENT_OTHER): Payer: Self-pay | Admitting: Physical Therapy

## 2022-05-11 ENCOUNTER — Ambulatory Visit: Payer: Federal, State, Local not specified - PPO | Admitting: Skilled Nursing Facility1

## 2022-05-17 DIAGNOSIS — E1169 Type 2 diabetes mellitus with other specified complication: Secondary | ICD-10-CM | POA: Diagnosis not present

## 2022-05-17 DIAGNOSIS — I10 Essential (primary) hypertension: Secondary | ICD-10-CM | POA: Diagnosis not present

## 2022-05-17 DIAGNOSIS — E785 Hyperlipidemia, unspecified: Secondary | ICD-10-CM | POA: Diagnosis not present

## 2022-05-24 ENCOUNTER — Encounter: Payer: Self-pay | Admitting: Dietician

## 2022-05-24 ENCOUNTER — Encounter: Payer: Federal, State, Local not specified - PPO | Attending: Surgery | Admitting: Dietician

## 2022-05-24 DIAGNOSIS — Z713 Dietary counseling and surveillance: Secondary | ICD-10-CM | POA: Insufficient documentation

## 2022-05-24 DIAGNOSIS — E669 Obesity, unspecified: Secondary | ICD-10-CM | POA: Insufficient documentation

## 2022-05-24 DIAGNOSIS — Z6841 Body Mass Index (BMI) 40.0 and over, adult: Secondary | ICD-10-CM | POA: Insufficient documentation

## 2022-05-24 NOTE — Progress Notes (Signed)
Supervised Weight Loss Visit Bariatric Nutrition Education Appt Start Time: 4:05    End Time: 4:40  Planned Surgery: RYGB   2 out of 3 SWL Appointments   NUTRITION ASSESSMENT  Anthropometrics  Start weight at [facility]: 263 lbs (date: 02/24/2022)  Height: 66 in Weight today: 266.1 lbs BMI: 42.95 kg/m2     Clinical  Medical hx: DM, HTN Medications: glimepiride, berberine, omega 3, vitamin B6, rybelsus, atorvastatin, amlodipine, bystolic   Labs: 17 May 2022 [facility] Everywhere: A1C 8.0, glucose 138, creatinine .51 Notable signs/symptoms: N/A Any previous deficiencies? No  Lifestyle & Dietary Hx  Pt states she has been checking her blood sugars twice a day.  Pt states she usually gets 160-170 in the morning, and afternoon is 146, 180.  Dietitian recommended getting a reading 2 hours after a meal. Pt states her last A1C was 8.0 on July 3rd, which is down from 10.8 in March. Pt states she is doing much better eating throughout the day, stating she is not skipping  meals and is getting snacks in as needed. Pt states she drink a lot of water Pt states she is sleeping really well right now. Pt is doing well on her chosen goals.   Estimated daily fluid intake: 64+ oz Supplements: B12 Current average weekly physical activity: walking in the evenings 3 days a week about a mile  24-Hr Dietary Recall First Meal: water, coffee (almost black) or tea 9 am protein egg, with spinach Snack: pretzels or nuts Second Meal:  3 pm salad Snack: peanut butter Third Meal: 6:30 baked chicken, broccoli or green beans Snack: popcorn or nuts (almond or pistachios) Beverages: water, coffee, beet juice, carrot juice   NUTRITION DIAGNOSIS  Overweight/obesity ([place]-3.3) related to past poor dietary habits and physical inactivity as evidenced by patient w/ planned RYGB surgery following dietary guidelines for continued weight loss.   NUTRITION INTERVENTION  Nutrition counseling (C-1) and education  (E-2) to facilitate bariatric surgery goals.  Why you need complex carbohydrates: Whole grains and other complex carbohydrates are required to have a healthy diet. Whole grains provide fiber which can help with blood glucose levels and help keep you satiated. Fruits and starchy vegetables provide essential vitamins and minerals required for immune function, eyesight support, brain support, bone density, wound healing and many other functions within the body. According to the current evidenced based 2020-2025 Dietary Guidelines for Americans, complex carbohydrates are part of a healthy eating pattern which is associated with a decreased risk for type 2 diabetes, cancers, and cardiovascular disease.   Pre-Op Goals Progress & New Goals Continue:  Check blood sugars 1-2 times a day, fasting glucose first thing in the morning and/or two hours after a meal Continue:  Consume 3 meals per day or try to eat every 3-5 hours Continue: not drinking fluids during meals New:  Increase activity  Handouts Provided Include  Reviewed Pre-op goals sheet patient brought in  Learning Style & Readiness for Change Teaching method utilized: Visual & Auditory  Demonstrated degree of understanding via: Teach Back  Readiness Level: action Barriers to learning/adherence to lifestyle change: mother passing away  RD's Notes for next Visit  Progress toward patient chosen goals Blood sugars and A1C   MONITORING & EVALUATION Dietary intake, weekly physical activity, body weight, and pre-op goals in 1 month.   Next Steps  Patient is to return to [facility] in one month for next SWL visit.

## 2022-06-03 ENCOUNTER — Ambulatory Visit (HOSPITAL_BASED_OUTPATIENT_CLINIC_OR_DEPARTMENT_OTHER): Payer: Federal, State, Local not specified - PPO | Admitting: Physical Therapy

## 2022-06-15 ENCOUNTER — Other Ambulatory Visit: Payer: Self-pay | Admitting: Obstetrics and Gynecology

## 2022-06-15 ENCOUNTER — Other Ambulatory Visit (HOSPITAL_COMMUNITY)
Admission: RE | Admit: 2022-06-15 | Discharge: 2022-06-15 | Disposition: A | Discharge status: Home / Self Care | Payer: Federal, State, Local not specified - PPO | Source: Ambulatory Visit | Attending: Obstetrics and Gynecology | Admitting: Obstetrics and Gynecology

## 2022-06-15 DIAGNOSIS — B977 Papillomavirus as the cause of diseases classified elsewhere: Secondary | ICD-10-CM | POA: Diagnosis not present

## 2022-06-15 DIAGNOSIS — Z01419 Encounter for gynecological examination (general) (routine) without abnormal findings: Secondary | ICD-10-CM | POA: Insufficient documentation

## 2022-06-17 LAB — CYTOLOGY - PAP
Comment: NEGATIVE
Diagnosis: NEGATIVE
High risk HPV: NEGATIVE

## 2022-06-22 ENCOUNTER — Encounter: Payer: Self-pay | Admitting: Dietician

## 2022-06-22 ENCOUNTER — Encounter: Payer: Federal, State, Local not specified - PPO | Attending: Surgery | Admitting: Dietician

## 2022-06-22 DIAGNOSIS — E669 Obesity, unspecified: Secondary | ICD-10-CM | POA: Diagnosis not present

## 2022-06-22 NOTE — Progress Notes (Signed)
Supervised Weight Loss Visit Bariatric Nutrition Education Appt Start Time: 4:55    End Time: 5:18  Planned Surgery: RYGB   3 out of 3 SWL Appointments   Pt completed visits.   Pt has cleared nutrition requirements.   NUTRITION ASSESSMENT  Anthropometrics  Start weight at [facility]: 263 lbs (date: 02/24/2022)  Height: 66 in Weight today: 265.9 lbs BMI: 42.92 kg/m2     Clinical  Medical hx: DM, HTN Medications: glimepiride, berberine, omega 3, vitamin B6, rybelsus, atorvastatin, amlodipine, bystolic   Labs: 17 May 2022 [facility] Everywhere: A1C 8.0, glucose 138, creatinine .51 Notable signs/symptoms: N/A Any previous deficiencies? No  Lifestyle & Dietary Hx  Pt states she has been busy this month with her dad in [place], stating he had knee surgery. Pt states her blood sugars have been doing pretty good and she is feeling better, but not drawing for blood sugars as much since being out of town with her father. Pt states her blood sugar this morning was 110. Pt states she is trying not to eat past 7 or 8 pm at night, to help her blood sugars in the morning  PT states she has cut down on coffee. Pt states she is trying to not drink with meals, stating she forgets to drink between meals.  Pt states she is trying to get back to drinking fluids throughout the day. Pt states she was able to have some intentional walking in [place], with nice weather and neighborhood was conducive to walking more. Pt states she walked the last couple of days, since being back from [place]. Pt states she is doing well and more consciously aware of smarter about the changes she is making and seeing the results in her blood sugars. Pt states she is more mindful. Pt states she will need to focus more on consistency with her changes and meal prep, and be better with the execution.   Estimated daily fluid intake: 64+ oz Supplements: B12 Current average weekly physical activity: walking in the evenings 3  days a week about a mile  24-Hr Dietary Recall First Meal: water, peppermint tea whole grain toast, egg, beet juice Snack: pretzels or nuts Second Meal:  3 pm salad Snack: peanut butter Third Meal: 6:30 baked sweet potato, salmon, green beans and sliced tomatoes Snack: popcorn or nuts (almond or pistachios) Beverages: water, coffee, beet juice, carrot juice   NUTRITION DIAGNOSIS  Overweight/obesity ([place]-3.3) related to past poor dietary habits and physical inactivity as evidenced by patient w/ planned RYGB surgery following dietary guidelines for continued weight loss.   NUTRITION INTERVENTION  Nutrition counseling (C-1) and education (E-2) to facilitate bariatric surgery goals.   Pre-Op Goals Progress & New Goals Continue:  Check blood sugars 1-2 times a day, fasting glucose first thing in the morning and/or two hours after a meal Continue:  Consume 3 meals per day or try to eat every 3-5 hours Continue: not drinking fluids during meals Continue:  Increase activity New: drink throughout the day; goal of 64 oz of fluids  Handouts Provided Include  Reviewed Pre-op goals sheet patient brought in  Learning Style & Readiness for Change Teaching method utilized: Visual & Auditory  Demonstrated degree of understanding via: Teach Back  Readiness Level: action Barriers to learning/adherence to lifestyle change: mother passing away  RD's Notes for next Visit  Progress toward patient chosen goals Blood sugars and A1C   MONITORING & EVALUATION Dietary intake, weekly physical activity, body weight, and  pre-op goals in 1 month.   Next Steps  Pt has completed visits. No further supervised visits required/recomended  Patient is to follow up at [facility] for Pre-Op Class >2 weeks before surgery for further nutrition education.

## 2022-07-26 ENCOUNTER — Encounter: Payer: Federal, State, Local not specified - PPO | Attending: Surgery | Admitting: Skilled Nursing Facility1

## 2022-07-26 ENCOUNTER — Encounter: Payer: Self-pay | Admitting: Skilled Nursing Facility1

## 2022-07-26 DIAGNOSIS — E669 Obesity, unspecified: Secondary | ICD-10-CM | POA: Diagnosis not present

## 2022-07-26 NOTE — Progress Notes (Signed)
Pre-Operative Nutrition Class:    Patient was seen on 07/26/2022 for Pre-Operative Bariatric Surgery Education at [facility]    Surgery date:  Surgery type: RYGB Start weight at [facility]: 263 Weight today: 268  Samples given per MNT protocol. Patient educated on appropriate usage:  Bariatric Advantage Multivitamin Lot # [id number] Exp: 08/24   Bariatric Advantage Calcium  Lot # [id number] Exp: 02/24/2023   Protein Shake Lot # [id number] / [id number] Exp: 15 Aug 2022 /  15 Nov 2022   The following the learning objectives were met by the patient during this course: Identify Pre-Op Dietary Goals and will begin 2 weeks pre-operatively Identify appropriate sources of fluids and proteins  State protein recommendations and appropriate sources pre and post-operatively Identify Post-Operative Dietary Goals and will follow for 2 weeks post-operatively Identify appropriate multivitamin and calcium sources Describe the need for physical activity post-operatively and will follow MD recommendations State when to call healthcare provider regarding medication questions or post-operative complications When having a diagnosis of diabetes understanding hypoglycemia symptoms and the inclusion of 1 complex carbohydrate per meal  Handouts given during class include: Pre-Op Bariatric Surgery Diet Handout Protein Shake Handout Post-Op Bariatric Surgery Nutrition Handout BELT Program Information Flyer Support Group Information Flyer [facility] Bariatric Supplements Price List  Follow-Up Plan: Patient will follow-up at [facility] 2 weeks post operatively for diet advancement per MD.

## 2022-08-11 NOTE — Progress Notes (Signed)
Sent message, via epic in basket, requesting orders in epic from surgeon.  

## 2022-08-12 ENCOUNTER — Ambulatory Visit: Payer: Self-pay | Admitting: Surgery

## 2022-08-12 DIAGNOSIS — Z01818 Encounter for other preprocedural examination: Secondary | ICD-10-CM

## 2022-08-13 NOTE — Patient Instructions (Signed)
DUE TO COVID-19 ONLY TWO VISITORS  (aged 56 and older)  ARE ALLOWED TO COME WITH YOU AND STAY IN THE WAITING ROOM ONLY DURING PRE OP AND PROCEDURE.   **NO VISITORS ARE ALLOWED IN THE SHORT STAY AREA OR RECOVERY ROOM!!**  IF YOU WILL BE ADMITTED INTO THE HOSPITAL YOU ARE ALLOWED ONLY FOUR SUPPORT PEOPLE DURING VISITATION HOURS ONLY (7 AM -8PM)   The support person(s) must pass our screening, gel in and out, and wear a mask at all times, including in the patient's room. Patients must also wear a mask when staff or their support person are in the room. Visitors GUEST BADGE MUST BE WORN VISIBLY  One adult visitor may remain with you overnight and MUST be in the room by 8 P.M.     Your procedure is scheduled on: 08/31/22   Report to [facility] Main Entrance    Report to admitting at  5:30 AM   Call this number if you have problems the morning of surgery [phone]   Do not eat food :After 6:00 PM    you may have the following liquids until _4:30_____ AM/ DAY OF SURGERY  Water Black Coffee (sugar ok, NO MILK/CREAM OR CREAMERS)  Tea (sugar ok, NO MILK/CREAM OR CREAMERS) regular and decaf                             Plain Jell-O (NO RED)                                           Fruit ices (not with fruit pulp, NO RED)                                     Popsicles (NO RED)                                                                  Juice: apple, WHITE grape, WHITE cranberry Sports drinks like Gatorade (NO RED)                   The day of surgery:  Drink ONE (1) Pre-Surgery Clear Ensure or G2 at 4:15 AM the morning of surgery. Drink in one sitting. Do not sip.  This drink was given to you during your hospital  pre-op appointment visit. Nothing else to drink after completing the  Pre-Surgery Clear Ensure or G2. At 4:30 AM          If you have questions, please contact your surgeon's office.    THE G2 DRINK YOU DRINK BEFORE YOU LEAVE HOME WILL BE THE LAST FLUIDS  YOU DRINK BEFORE SURGERY THEN NOTHING MORE BY MOUTH.   PAIN IS EXPECTED AFTER SURGERY AND WILL NOT BE COMPLETELY ELIMINATED.   AMBULATION AND TYLENOL WILL HELP REDUCE INCISIONAL AND GAS PAIN. MOVEMENT IS KEY!  YOU ARE EXPECTED TO BE OUT OF BED WITHIN 4 HOURS OF ADMISSION TO YOUR PATIENT ROOM.  SITTING IN THE RECLINER THROUGHOUT THE DAY IS IMPORTANT FOR DRINKING FLUIDS AND MOVING  GAS THROUGHOUT THE GI TRACT.  COMPRESSION STOCKINGS SHOULD BE WORN [employer] UNLESS YOU ARE WALKING.   INCENTIVE SPIROMETER SHOULD BE USED EVERY HOUR WHILE AWAKE TO DECREASE POST-OPERATIVE COMPLICATIONS SUCH AS PNEUMONIA.  WHEN DISCHARGED HOME, IT IS IMPORTANT TO CONTINUE TO WALK EVERY HOUR AND USE THE INCENTIVE SPIROMETER EVERY HOUR.    Oral Hygiene is also important to reduce your risk of infection.                                    Remember - BRUSH YOUR TEETH THE MORNING OF SURGERY WITH YOUR REGULAR TOOTHPASTE   Do NOT smoke after Midnight   Take these medicines the morning of surgery with A SIP OF WATER: Lipitor- Atorvastatin                                                                                                                             Nebivolol- Bystolic                                                                                                                             Amlodipine- Norvasc  DO NOT TAKE ANY ORAL DIABETIC MEDICATIONS DAY OF YOUR SURGERY (Glimepiride, Semaglutide  Bring CPAP mask and tubing day of surgery.                              You may not have any metal on your body including hair pins, jewelry, and body piercing             Do not wear make-up, lotions, powders, perfumes/cologne, or deodorant  Do not wear nail polish including gel and S&S, artificial/acrylic nails, or any other type of covering on natural nails including finger and toenails. If you have artificial nails, gel coating, etc. that needs to be removed by a nail salon please have  this removed prior to surgery or surgery may need to be canceled/ delayed if the surgeon/ anesthesia feels like they are unable to be safely monitored.   Do not shave  48 hours prior to surgery.    Do not bring valuables to the hospital. [place].   Contacts, dentures  or bridgework may not be worn into surgery.   Bring small overnight bag day of surgery.   DO NOT [place]. PHARMACY WILL DISPENSE MEDICATIONS LISTED ON YOUR MEDICATION LIST TO YOU DURING YOUR ADMISSION [place]!     Special Instructions: Bring a copy of your healthcare power of attorney and living will documents  the day of surgery if you haven't scanned them before.              Please read over the following fact sheets you were given: IF YOU HAVE QUESTIONS ABOUT YOUR PRE-OP INSTRUCTIONS PLEASE CALL [phone]     [facility] - Preparing for Surgery Before surgery, you can play an important role.  Because skin is not sterile, your skin needs to be as free of germs as possible.  You can reduce the number of germs on your skin by washing with CHG (chlorahexidine gluconate) soap before surgery.  CHG is an antiseptic cleaner which kills germs and bonds with the skin to continue killing germs even after washing. Please DO NOT use if you have an allergy to CHG or antibacterial soaps.  If your skin becomes reddened/irritated stop using the CHG and inform your nurse when you arrive at Short Stay. Do not shave (including legs and underarms) for at least 48 hours prior to the first CHG shower.  Please follow these instructions carefully:  1.  Shower with CHG Soap the night before surgery and the  morning of Surgery.  2.  If you choose to wash your hair, wash your hair first as usual with your  normal  shampoo.  3.  After you shampoo, rinse your hair and body thoroughly to remove the  shampoo.                            4.  Use CHG as you  would any other liquid soap.  You can apply chg directly  to the skin and wash                       Gently with a scrungie or clean washcloth.  5.  Apply the CHG Soap to your body ONLY FROM THE NECK DOWN.   Do not use on face/ open                           Wound or open sores. Avoid contact with eyes, ears mouth and genitals (private parts).                       Wash face,  Genitals (private parts) with your normal soap.             6.  Wash thoroughly, paying special attention to the area where your surgery  will be performed.  7.  Thoroughly rinse your body with warm water from the neck down.  8.  DO NOT shower/wash with your normal soap after using and rinsing off  the CHG Soap.                9.  Pat yourself dry with a clean towel.            10.  Wear clean pajamas.            11.  Place clean sheets on your bed the night of your first  shower and do not  sleep with pets. Day of Surgery : Do not apply any lotions/deodorants the morning of surgery.  Please wear clean clothes to the hospital/surgery center.  FAILURE TO FOLLOW THESE INSTRUCTIONS MAY RESULT IN THE CANCELLATION OF YOUR SURGERY    ________________________________________________________________________   Incentive Spirometer  An incentive spirometer is a tool that can help keep your lungs clear and active. This tool measures how well you are filling your lungs with each breath. Taking long deep breaths may help reverse or decrease the chance of developing breathing (pulmonary) problems (especially infection) following: A long period of time when you are unable to move or be active. BEFORE THE PROCEDURE  If the spirometer includes an indicator to show your best effort, your nurse or respiratory therapist will set it to a desired goal. If possible, sit up straight or lean slightly forward. Try not to slouch. Hold the incentive spirometer in an upright position. INSTRUCTIONS FOR USE  Sit on the edge of your bed if  possible, or sit up as far as you can in bed or on a chair. Hold the incentive spirometer in an upright position. Breathe out normally. Place the mouthpiece in your mouth and seal your lips tightly around it. Breathe in slowly and as deeply as possible, raising the piston or the ball toward the top of the column. Hold your breath for 3-5 seconds or for as long as possible. Allow the piston or ball to fall to the bottom of the column. Remove the mouthpiece from your mouth and breathe out normally. Rest for a few seconds and repeat Steps 1 through 7 at least 10 times every 1-2 hours when you are awake. Take your time and take a few normal breaths between deep breaths. The spirometer may include an indicator to show your best effort. Use the indicator as a goal to work toward during each repetition. After each set of 10 deep breaths, practice coughing to be sure your lungs are clear. If you have an incision (the cut made at the time of surgery), support your incision when coughing by placing a pillow or rolled up towels firmly against it. Once you are able to get out of bed, walk around indoors and cough well. You may stop using the incentive spirometer when instructed by your caregiver.  RISKS AND COMPLICATIONS Take your time so you do not get dizzy or light-headed. If you are in pain, you may need to take or ask for pain medication before doing incentive spirometry. It is harder to take a deep breath if you are having pain. AFTER USE Rest and breathe slowly and easily. It can be helpful to keep track of a log of your progress. Your caregiver can provide you with a simple table to help with this. If you are using the spirometer at home, follow these instructions: [place] IF:  You are having difficultly using the spirometer. You have trouble using the spirometer as often as instructed. Your pain medication is not giving enough relief while using the spirometer. You develop fever of  100.5 F (38.1 C) or higher. SEEK IMMEDIATE MEDICAL CARE IF:  You cough up bloody sputum that had not been present before. You develop fever of 102 F (38.9 C) or greater. You develop worsening pain at or near the incision site. MAKE SURE YOU:  Understand these instructions. Will watch your condition. Will get help right away if you are not doing well or get worse. Document Released: 03/14/2007  Document Revised: 01/24/2012 Document Reviewed: 05/15/2007 [facility] Patient Information 2014 [place], [place].   ________________________________________________________________________

## 2022-08-18 ENCOUNTER — Encounter (HOSPITAL_COMMUNITY): Payer: Self-pay

## 2022-08-18 ENCOUNTER — Other Ambulatory Visit: Payer: Self-pay

## 2022-08-18 ENCOUNTER — Encounter (HOSPITAL_COMMUNITY)
Admission: RE | Admit: 2022-08-18 | Discharge: 2022-08-18 | Disposition: A | Discharge status: Home / Self Care | Payer: Federal, State, Local not specified - PPO | Source: Ambulatory Visit | Attending: Surgery | Admitting: Surgery

## 2022-08-18 DIAGNOSIS — E119 Type 2 diabetes mellitus without complications: Secondary | ICD-10-CM | POA: Insufficient documentation

## 2022-08-18 DIAGNOSIS — Z01812 Encounter for preprocedural laboratory examination: Secondary | ICD-10-CM | POA: Diagnosis not present

## 2022-08-18 DIAGNOSIS — Z01818 Encounter for other preprocedural examination: Secondary | ICD-10-CM

## 2022-08-18 HISTORY — DX: Personal history of other diseases of the digestive system: Z87.19

## 2022-08-18 LAB — COMPREHENSIVE METABOLIC PANEL
ALT: 22 U/L (ref 0–44)
AST: 19 U/L (ref 15–41)
Albumin: 4.3 g/dL (ref 3.5–5.0)
Alkaline Phosphatase: 68 U/L (ref 38–126)
Anion gap: 6 (ref 5–15)
BUN: 8 mg/dL (ref 6–20)
CO2: 26 mmol/L (ref 22–32)
Calcium: 9.4 mg/dL (ref 8.9–10.3)
Chloride: 109 mmol/L (ref 98–111)
Creatinine, Ser: 0.45 mg/dL (ref 0.44–1.00)
GFR, Estimated: >60 mL/min (ref >60)
Glucose, Bld: 194 mg/dL — ABNORMAL HIGH (ref 70–99)
Potassium: 3.8 mmol/L (ref 3.5–5.1)
Sodium: 141 mmol/L (ref 135–145)
Total Bilirubin: 0.7 mg/dL (ref 0.3–1.2)
Total Protein: 7.5 g/dL (ref 6.5–8.1)

## 2022-08-18 LAB — CBC WITH DIFFERENTIAL/PLATELET
Abs Immature Granulocytes: 0.02 10*3/uL (ref 0.00–0.07)
Basophils Absolute: 0.1 10*3/uL (ref 0.0–0.1)
Basophils Relative: 1 %
Eosinophils Absolute: 0.2 10*3/uL (ref 0.0–0.5)
Eosinophils Relative: 3 %
HCT: 41.6 % (ref 36.0–46.0)
Hemoglobin: 13 g/dL (ref 12.0–15.0)
Immature Granulocytes: 0 %
Lymphocytes Relative: 53 %
Lymphs Abs: 4.3 10*3/uL — ABNORMAL HIGH (ref 0.7–4.0)
MCH: 27.1 pg (ref 26.0–34.0)
MCHC: 31.3 g/dL (ref 30.0–36.0)
MCV: 86.8 fL (ref 80.0–100.0)
Monocytes Absolute: 0.6 10*3/uL (ref 0.1–1.0)
Monocytes Relative: 7 %
Neutro Abs: 3 10*3/uL (ref 1.7–7.7)
Neutrophils Relative %: 36 %
Platelets: 330 10*3/uL (ref 150–400)
RBC: 4.79 MIL/uL (ref 3.87–5.11)
RDW: 13.2 % (ref 11.5–15.5)
WBC: 8.1 10*3/uL (ref 4.0–10.5)
nRBC: 0 % (ref 0.0–0.2)

## 2022-08-18 LAB — TYPE AND SCREEN
ABO/RH(D): A POS
Antibody Screen: NEGATIVE

## 2022-08-18 LAB — GLUCOSE, CAPILLARY: Glucose-Capillary: 209 mg/dL — ABNORMAL HIGH (ref 70–99)

## 2022-08-18 LAB — HEMOGLOBIN A1C
Hgb A1c MFr Bld: 7.4 % — ABNORMAL HIGH (ref 4.8–5.6)
Mean Plasma Glucose: 165.68 mg/dL

## 2022-08-18 NOTE — Progress Notes (Addendum)
For Short Stay: [place] appointment date:N/A Date of COVID positive in last 13 days:N/A  Bowel Prep reminder:N/A   For Anesthesia: PCP - Kathyrn Lass, MD Cardiologist - N/A  Chest x-ray - 02/09/22 in epic EKG - 02/08/22 in epic Stress Test - N/A ECHO - N/A Cardiac Cath - N/A Pacemaker/ICD device last checked: N/A Pacemaker orders received:N/A Device Rep notified:N/A  Spinal Cord Stimulator:N/A  Sleep Study - N/A CPAP - N/A  Fasting Blood Sugar - 160-210 Checks Blood Sugar ___2-3__ times a day Date and result of last Hgb A1c-  Blood Thinner InstructioN/Ans: N/A Aspirin Instructions:  Last Dose: N/A  Activity level:  Able to exercise without chest pain and/or shortness of breath        Anesthesia review:   Patient denies shortness of breath, fever, cough and chest pain at PAT appointment   Patient verbalized understanding of instructions that were given to them at the PAT appointment. Patient was also instructed that they will need to review over the PAT instructions again at home before surgery.

## 2022-08-30 ENCOUNTER — Encounter (HOSPITAL_COMMUNITY): Payer: Self-pay | Admitting: Surgery

## 2022-08-30 NOTE — Anesthesia Preprocedure Evaluation (Signed)
Anesthesia Evaluation  Patient identified by MRN, date of birth, ID band Patient awake    Reviewed: Allergy & Precautions, NPO status , Patient's Chart, lab work & pertinent test results, reviewed documented beta blocker date and time   Airway Mallampati: III  TM Distance: >3 FB Neck ROM: Full    Dental no notable dental hx. (+) Teeth Intact, Dental Advisory Given   Pulmonary neg pulmonary ROS, former smoker,    Pulmonary exam normal breath sounds clear to auscultation       Cardiovascular hypertension, Pt. on medications and Pt. on home beta blockers Normal cardiovascular exam Rhythm:Regular Rate:Normal  EKG 02/08/22 NSR, minimal voltage criteria for LVH   Neuro/Psych negative neurological ROS  negative psych ROS   GI/Hepatic Neg liver ROS, hiatal hernia,   Endo/Other  diabetes, Well Controlled, Type 2, Oral Hypoglycemic AgentsMorbid obesityGLP-1 agonist therapy Hb A1c 7.4  Renal/GU negative Renal ROS  negative genitourinary   Musculoskeletal negative musculoskeletal ROS (+)   Abdominal (+) + obese,   Peds  Hematology negative hematology ROS (+)   Anesthesia Other Findings   Reproductive/Obstetrics                            Anesthesia Physical Anesthesia Plan  ASA: 3  Anesthesia Plan: General   Post-op Pain Management: Precedex, Ketamine IV*, Tylenol PO (pre-op)* and Dilaudid IV   Induction: Intravenous, Rapid sequence and Cricoid pressure planned  PONV Risk Score and Plan: Scopolamine patch - Pre-op, Treatment may vary due to age or medical condition, Midazolam, Dexamethasone and Ondansetron  Airway Management Planned: Oral ETT  Additional Equipment: None  Intra-op Plan:   Post-operative Plan: Extubation in OR  Informed Consent: I have reviewed the patients History and Physical, chart, labs and discussed the procedure including the risks, benefits and alternatives for the  proposed anesthesia with the patient or authorized representative who has indicated his/her understanding and acceptance.     Dental advisory given  Plan Discussed with: CRNA and Anesthesiologist  Anesthesia Plan Comments:        Anesthesia Quick Evaluation

## 2022-08-31 ENCOUNTER — Inpatient Hospital Stay (HOSPITAL_COMMUNITY): Payer: Federal, State, Local not specified - PPO | Admitting: Anesthesiology

## 2022-08-31 ENCOUNTER — Encounter (HOSPITAL_COMMUNITY)
Admission: RE | Disposition: A | Discharge status: Home / Self Care | Payer: Self-pay | Source: Ambulatory Visit | Attending: Surgery

## 2022-08-31 ENCOUNTER — Other Ambulatory Visit: Payer: Self-pay

## 2022-08-31 ENCOUNTER — Encounter (HOSPITAL_COMMUNITY): Payer: Self-pay | Admitting: Surgery

## 2022-08-31 ENCOUNTER — Inpatient Hospital Stay (HOSPITAL_COMMUNITY)
Admission: RE | Admit: 2022-08-31 | Discharge: 2022-09-01 | DRG: 621 | Disposition: A | Discharge status: Home / Self Care | Payer: Federal, State, Local not specified - PPO | Source: Ambulatory Visit | Attending: Surgery | Admitting: Surgery

## 2022-08-31 DIAGNOSIS — Z7984 Long term (current) use of oral hypoglycemic drugs: Secondary | ICD-10-CM | POA: Diagnosis not present

## 2022-08-31 DIAGNOSIS — Z6841 Body Mass Index (BMI) 40.0 and over, adult: Secondary | ICD-10-CM

## 2022-08-31 DIAGNOSIS — K449 Diaphragmatic hernia without obstruction or gangrene: Secondary | ICD-10-CM | POA: Diagnosis not present

## 2022-08-31 DIAGNOSIS — Z7985 Long-term (current) use of injectable non-insulin antidiabetic drugs: Secondary | ICD-10-CM

## 2022-08-31 DIAGNOSIS — Z91030 Bee allergy status: Secondary | ICD-10-CM | POA: Diagnosis not present

## 2022-08-31 DIAGNOSIS — Z888 Allergy status to other drugs, medicaments and biological substances status: Secondary | ICD-10-CM

## 2022-08-31 DIAGNOSIS — Z01818 Encounter for other preprocedural examination: Secondary | ICD-10-CM

## 2022-08-31 DIAGNOSIS — Z880 Allergy status to penicillin: Secondary | ICD-10-CM

## 2022-08-31 DIAGNOSIS — Z87891 Personal history of nicotine dependence: Secondary | ICD-10-CM | POA: Diagnosis not present

## 2022-08-31 DIAGNOSIS — I10 Essential (primary) hypertension: Secondary | ICD-10-CM | POA: Diagnosis present

## 2022-08-31 DIAGNOSIS — E119 Type 2 diabetes mellitus without complications: Secondary | ICD-10-CM | POA: Diagnosis not present

## 2022-08-31 DIAGNOSIS — Z79899 Other long term (current) drug therapy: Secondary | ICD-10-CM

## 2022-08-31 DIAGNOSIS — Z833 Family history of diabetes mellitus: Secondary | ICD-10-CM

## 2022-08-31 HISTORY — PX: HIATAL HERNIA REPAIR: SHX195

## 2022-08-31 LAB — GLUCOSE, CAPILLARY
Glucose-Capillary: 152 mg/dL — ABNORMAL HIGH (ref 70–99)
Glucose-Capillary: 239 mg/dL — ABNORMAL HIGH (ref 70–99)
Glucose-Capillary: 266 mg/dL — ABNORMAL HIGH (ref 70–99)
Glucose-Capillary: 219 mg/dL — ABNORMAL HIGH (ref 70–99)
Glucose-Capillary: 214 mg/dL — ABNORMAL HIGH (ref 70–99)

## 2022-08-31 LAB — CBC
HCT: 42.9 % (ref 36.0–46.0)
Hemoglobin: 13.1 g/dL (ref 12.0–15.0)
MCH: 27 pg (ref 26.0–34.0)
MCHC: 30.5 g/dL (ref 30.0–36.0)
MCV: 88.5 fL (ref 80.0–100.0)
Platelets: 340 10*3/uL (ref 150–400)
RBC: 4.85 MIL/uL (ref 3.87–5.11)
RDW: 13.2 % (ref 11.5–15.5)
WBC: 14.3 10*3/uL — ABNORMAL HIGH (ref 4.0–10.5)
nRBC: 0 % (ref 0.0–0.2)

## 2022-08-31 LAB — ABO/RH: ABO/RH(D): A POS

## 2022-08-31 LAB — CREATININE, SERUM
Creatinine, Ser: 0.59 mg/dL (ref 0.44–1.00)
GFR, Estimated: >60 mL/min (ref >60)

## 2022-08-31 SURGERY — CREATION, GASTRIC BYPASS, ROUX-EN-Y, ROBOT-ASSISTED
Anesthesia: General | Site: Esophagus

## 2022-08-31 MED ORDER — SCOPOLAMINE 1 MG/3DAYS TD PT72
1.0000 | MEDICATED_PATCH | TRANSDERMAL | Status: DC
  Administered 2022-08-31: 1.5 mg via TRANSDERMAL
  Filled 2022-08-31: qty 1

## 2022-08-31 MED ORDER — PROPOFOL 10 MG/ML IV BOLUS
INTRAVENOUS | Status: DC | PRN
  Administered 2022-08-31: 200 mg via INTRAVENOUS

## 2022-08-31 MED ORDER — APREPITANT 40 MG PO CAPS
40.0000 mg | ORAL_CAPSULE | ORAL | Status: AC
  Administered 2022-08-31: 40 mg via ORAL
  Filled 2022-08-31: qty 1

## 2022-08-31 MED ORDER — BUPIVACAINE LIPOSOME 1.3 % IJ SUSP
INTRAMUSCULAR | Status: AC
  Filled 2022-08-31: qty 20

## 2022-08-31 MED ORDER — NEBIVOLOL HCL 10 MG PO TABS
10.0000 mg | ORAL_TABLET | Freq: Every day | ORAL | Status: DC
  Administered 2022-09-01: 10 mg via ORAL
  Filled 2022-08-31: qty 1

## 2022-08-31 MED ORDER — FENTANYL CITRATE (PF) 100 MCG/2ML IJ SOLN
INTRAMUSCULAR | Status: AC
  Filled 2022-08-31: qty 2

## 2022-08-31 MED ORDER — SUGAMMADEX SODIUM 500 MG/5ML IV SOLN
INTRAVENOUS | Status: DC | PRN
  Administered 2022-08-31: 250 mg via INTRAVENOUS

## 2022-08-31 MED ORDER — BUPIVACAINE-EPINEPHRINE 0.25% -1:200000 IJ SOLN
INTRAMUSCULAR | Status: DC | PRN
  Administered 2022-08-31: 30 mL

## 2022-08-31 MED ORDER — BUPIVACAINE-EPINEPHRINE (PF) 0.25% -1:200000 IJ SOLN
INTRAMUSCULAR | Status: AC
  Filled 2022-08-31: qty 30

## 2022-08-31 MED ORDER — PROPOFOL 10 MG/ML IV BOLUS
INTRAVENOUS | Status: AC
  Filled 2022-08-31: qty 20

## 2022-08-31 MED ORDER — LACTATED RINGERS IV SOLN: INTRAVENOUS | Status: DC

## 2022-08-31 MED ORDER — AMISULPRIDE (ANTIEMETIC) 5 MG/2ML IV SOLN: 10.0000 mg | Freq: Once | INTRAVENOUS | Status: DC | PRN

## 2022-08-31 MED ORDER — INSULIN ASPART 100 UNIT/ML IJ SOLN: 5.0000 [IU] | Freq: Once | INTRAMUSCULAR | Status: AC

## 2022-08-31 MED ORDER — LIDOCAINE 2% (20 MG/ML) 5 ML SYRINGE
INTRAMUSCULAR | Status: DC | PRN
  Administered 2022-08-31: 100 mg via INTRAVENOUS

## 2022-08-31 MED ORDER — BUPIVACAINE LIPOSOME 1.3 % IJ SUSP
INTRAMUSCULAR | Status: DC | PRN
  Administered 2022-08-31: 20 mL

## 2022-08-31 MED ORDER — BUPIVACAINE LIPOSOME 1.3 % IJ SUSP: 20.0000 mL | Freq: Once | INTRAMUSCULAR | Status: DC

## 2022-08-31 MED ORDER — INSULIN ASPART 100 UNIT/ML IJ SOLN
0.0000 [IU] | Freq: Every day | INTRAMUSCULAR | Status: DC
  Administered 2022-08-31: 2 [IU] via SUBCUTANEOUS

## 2022-08-31 MED ORDER — PANTOPRAZOLE SODIUM 40 MG IV SOLR
40.0000 mg | Freq: Every day | INTRAVENOUS | Status: DC
  Administered 2022-08-31: 40 mg via INTRAVENOUS
  Filled 2022-08-31: qty 10

## 2022-08-31 MED ORDER — PHENYLEPHRINE HCL (PRESSORS) 10 MG/ML IV SOLN
INTRAVENOUS | Status: AC
  Filled 2022-08-31: qty 1

## 2022-08-31 MED ORDER — ROCURONIUM BROMIDE 10 MG/ML (PF) SYRINGE
PREFILLED_SYRINGE | INTRAVENOUS | Status: AC
  Filled 2022-08-31: qty 10

## 2022-08-31 MED ORDER — ACETAMINOPHEN 500 MG PO TABS
1000.0000 mg | ORAL_TABLET | Freq: Three times a day (TID) | ORAL | Status: DC
  Administered 2022-08-31 – 2022-09-01 (×4): 1000 mg via ORAL
  Filled 2022-08-31 (×4): qty 2

## 2022-08-31 MED ORDER — HYDROMORPHONE HCL 1 MG/ML IJ SOLN
0.2500 mg | INTRAMUSCULAR | Status: DC | PRN
  Administered 2022-08-31 (×2): 0.5 mg via INTRAVENOUS

## 2022-08-31 MED ORDER — ACETAMINOPHEN 500 MG PO TABS
1000.0000 mg | ORAL_TABLET | ORAL | Status: AC
  Administered 2022-08-31: 1000 mg via ORAL
  Filled 2022-08-31: qty 2

## 2022-08-31 MED ORDER — ONDANSETRON HCL 4 MG/2ML IJ SOLN: 4.0000 mg | INTRAMUSCULAR | Status: DC | PRN

## 2022-08-31 MED ORDER — DEXAMETHASONE SODIUM PHOSPHATE 10 MG/ML IJ SOLN
INTRAMUSCULAR | Status: AC
  Filled 2022-08-31: qty 2

## 2022-08-31 MED ORDER — EPHEDRINE 5 MG/ML INJ
INTRAVENOUS | Status: AC
  Filled 2022-08-31: qty 5

## 2022-08-31 MED ORDER — SIMETHICONE 80 MG PO CHEW
80.0000 mg | CHEWABLE_TABLET | Freq: Four times a day (QID) | ORAL | Status: DC | PRN
  Administered 2022-08-31: 80 mg via ORAL
  Filled 2022-08-31: qty 1

## 2022-08-31 MED ORDER — SUGAMMADEX SODIUM 500 MG/5ML IV SOLN
INTRAVENOUS | Status: AC
  Filled 2022-08-31: qty 5

## 2022-08-31 MED ORDER — PHENYLEPHRINE HCL-NACL 20-0.9 MG/250ML-% IV SOLN
INTRAVENOUS | Status: DC | PRN
  Administered 2022-08-31: 40 ug/min via INTRAVENOUS

## 2022-08-31 MED ORDER — HYDRALAZINE HCL 20 MG/ML IJ SOLN
INTRAMUSCULAR | Status: AC
  Filled 2022-08-31: qty 1

## 2022-08-31 MED ORDER — ONDANSETRON HCL 4 MG/2ML IJ SOLN: 4.0000 mg | Freq: Once | INTRAMUSCULAR | Status: DC | PRN

## 2022-08-31 MED ORDER — CHLORHEXIDINE GLUCONATE 0.12 % MT SOLN
15.0000 mL | Freq: Once | OROMUCOSAL | Status: AC
  Administered 2022-08-31: 15 mL via OROMUCOSAL

## 2022-08-31 MED ORDER — CHLORHEXIDINE GLUCONATE CLOTH 2 % EX PADS: 6.0000 | MEDICATED_PAD | Freq: Once | CUTANEOUS | Status: DC

## 2022-08-31 MED ORDER — INSULIN ASPART 100 UNIT/ML IJ SOLN
INTRAMUSCULAR | Status: AC
  Administered 2022-08-31: 5 [IU] via SUBCUTANEOUS
  Filled 2022-08-31: qty 1

## 2022-08-31 MED ORDER — LIDOCAINE HCL (PF) 2 % IJ SOLN
INTRAMUSCULAR | Status: AC
  Filled 2022-08-31: qty 10

## 2022-08-31 MED ORDER — SODIUM CHLORIDE 0.9 % IV SOLN
2.0000 g | INTRAVENOUS | Status: AC
  Administered 2022-08-31: 2 g via INTRAVENOUS
  Filled 2022-08-31: qty 2

## 2022-08-31 MED ORDER — HYDRALAZINE HCL 20 MG/ML IJ SOLN
10.0000 mg | INTRAMUSCULAR | Status: DC | PRN
  Administered 2022-08-31: 10 mg via INTRAVENOUS

## 2022-08-31 MED ORDER — ONDANSETRON HCL 4 MG/2ML IJ SOLN
INTRAMUSCULAR | Status: DC | PRN
  Administered 2022-08-31 (×2): 4 mg via INTRAVENOUS

## 2022-08-31 MED ORDER — LACTATED RINGERS IR SOLN
Status: DC | PRN
  Administered 2022-08-31: 1000 mL

## 2022-08-31 MED ORDER — ONDANSETRON HCL 4 MG/2ML IJ SOLN
INTRAMUSCULAR | Status: AC
  Filled 2022-08-31: qty 2

## 2022-08-31 MED ORDER — ENSURE MAX PROTEIN PO LIQD
2.0000 [oz_av] | ORAL | Status: DC
  Administered 2022-09-01 (×4): 2 [oz_av] via ORAL

## 2022-08-31 MED ORDER — MORPHINE SULFATE (PF) 2 MG/ML IV SOLN: 1.0000 mg | INTRAVENOUS | Status: DC | PRN

## 2022-08-31 MED ORDER — KETAMINE HCL 10 MG/ML IJ SOLN
INTRAMUSCULAR | Status: DC | PRN
  Administered 2022-08-31: 20 mg via INTRAVENOUS
  Administered 2022-08-31: 10 mg via INTRAVENOUS

## 2022-08-31 MED ORDER — DEXMEDETOMIDINE HCL IN NACL 80 MCG/20ML IV SOLN
INTRAVENOUS | Status: AC
  Filled 2022-08-31: qty 20

## 2022-08-31 MED ORDER — HEPARIN SODIUM (PORCINE) 5000 UNIT/ML IJ SOLN
5000.0000 [IU] | Freq: Three times a day (TID) | INTRAMUSCULAR | Status: DC
  Administered 2022-08-31 – 2022-09-01 (×3): 5000 [IU] via SUBCUTANEOUS
  Filled 2022-08-31 (×3): qty 1

## 2022-08-31 MED ORDER — MIDAZOLAM HCL 5 MG/5ML IJ SOLN
INTRAMUSCULAR | Status: DC | PRN
  Administered 2022-08-31: 2 mg via INTRAVENOUS

## 2022-08-31 MED ORDER — LOSARTAN POTASSIUM 50 MG PO TABS
100.0000 mg | ORAL_TABLET | Freq: Every day | ORAL | Status: DC
  Administered 2022-09-01: 100 mg via ORAL
  Filled 2022-08-31: qty 2

## 2022-08-31 MED ORDER — EPHEDRINE SULFATE-NACL 50-0.9 MG/10ML-% IV SOSY
PREFILLED_SYRINGE | INTRAVENOUS | Status: DC | PRN
  Administered 2022-08-31: 10 mg via INTRAVENOUS
  Administered 2022-08-31 (×2): 5 mg via INTRAVENOUS

## 2022-08-31 MED ORDER — KETAMINE HCL 10 MG/ML IJ SOLN
INTRAMUSCULAR | Status: AC
  Filled 2022-08-31: qty 1

## 2022-08-31 MED ORDER — HYDROMORPHONE HCL 1 MG/ML IJ SOLN
INTRAMUSCULAR | Status: AC
  Filled 2022-08-31: qty 1

## 2022-08-31 MED ORDER — AMLODIPINE BESYLATE 10 MG PO TABS
10.0000 mg | ORAL_TABLET | Freq: Every day | ORAL | Status: DC
  Administered 2022-09-01: 10 mg via ORAL
  Filled 2022-08-31: qty 1

## 2022-08-31 MED ORDER — INSULIN ASPART 100 UNIT/ML IJ SOLN
0.0000 [IU] | Freq: Three times a day (TID) | INTRAMUSCULAR | Status: DC
  Administered 2022-08-31: 5 [IU] via SUBCUTANEOUS
  Administered 2022-09-01: 2 [IU] via SUBCUTANEOUS
  Administered 2022-09-01: 3 [IU] via SUBCUTANEOUS

## 2022-08-31 MED ORDER — MIDAZOLAM HCL 2 MG/2ML IJ SOLN
INTRAMUSCULAR | Status: AC
  Filled 2022-08-31: qty 2

## 2022-08-31 MED ORDER — FENTANYL CITRATE (PF) 100 MCG/2ML IJ SOLN
INTRAMUSCULAR | Status: DC | PRN
  Administered 2022-08-31: 100 ug via INTRAVENOUS
  Administered 2022-08-31 (×2): 50 ug via INTRAVENOUS

## 2022-08-31 MED ORDER — ROCURONIUM BROMIDE 10 MG/ML (PF) SYRINGE
PREFILLED_SYRINGE | INTRAVENOUS | Status: AC
  Filled 2022-08-31: qty 20

## 2022-08-31 MED ORDER — ATORVASTATIN CALCIUM 20 MG PO TABS
20.0000 mg | ORAL_TABLET | Freq: Every day | ORAL | Status: DC
  Administered 2022-09-01: 20 mg via ORAL
  Filled 2022-08-31: qty 1

## 2022-08-31 MED ORDER — HEPARIN SODIUM (PORCINE) 5000 UNIT/ML IJ SOLN
5000.0000 [IU] | INTRAMUSCULAR | Status: AC
  Administered 2022-08-31: 5000 [IU] via SUBCUTANEOUS
  Filled 2022-08-31: qty 1

## 2022-08-31 MED ORDER — DEXAMETHASONE SODIUM PHOSPHATE 10 MG/ML IJ SOLN
INTRAMUSCULAR | Status: DC | PRN
  Administered 2022-08-31: 10 mg via INTRAVENOUS

## 2022-08-31 MED ORDER — ROCURONIUM BROMIDE 10 MG/ML (PF) SYRINGE
PREFILLED_SYRINGE | INTRAVENOUS | Status: DC | PRN
  Administered 2022-08-31: 100 mg via INTRAVENOUS
  Administered 2022-08-31: 30 mg via INTRAVENOUS
  Administered 2022-08-31: 20 mg via INTRAVENOUS
  Administered 2022-08-31: 10 mg via INTRAVENOUS

## 2022-08-31 MED ORDER — OXYCODONE HCL 5 MG/5ML PO SOLN: 5.0000 mg | Freq: Four times a day (QID) | ORAL | Status: DC | PRN

## 2022-08-31 MED ORDER — ACETAMINOPHEN 160 MG/5ML PO SOLN: 1000.0000 mg | Freq: Three times a day (TID) | ORAL | Status: DC

## 2022-08-31 MED ORDER — LORATADINE 10 MG PO TABS
10.0000 mg | ORAL_TABLET | Freq: Every day | ORAL | Status: DC
  Administered 2022-09-01: 10 mg via ORAL
  Filled 2022-08-31: qty 1

## 2022-08-31 MED ORDER — GABAPENTIN 300 MG PO CAPS
300.0000 mg | ORAL_CAPSULE | ORAL | Status: AC
  Administered 2022-08-31: 300 mg via ORAL
  Filled 2022-08-31: qty 1

## 2022-08-31 MED ORDER — ORAL CARE MOUTH RINSE: 15.0000 mL | Freq: Once | OROMUCOSAL | Status: AC

## 2022-08-31 MED ORDER — ONDANSETRON HCL 4 MG/2ML IJ SOLN
INTRAMUSCULAR | Status: AC
  Filled 2022-08-31: qty 4

## 2022-08-31 SURGICAL SUPPLY — 77 items
ADH SKN CLS APL DERMABOND .7 (GAUZE/BANDAGES/DRESSINGS) ×2
APL PRP STRL LF DISP 70% ISPRP (MISCELLANEOUS) ×4
APPLIER CLIP 5 13 M/L LIGAMAX5 (MISCELLANEOUS)
APPLIER CLIP ROT 10 11.4 M/L (STAPLE)
APR CLP MED LRG 11.4X10 (STAPLE)
APR CLP MED LRG 5 ANG JAW (MISCELLANEOUS)
BLADE SURG SZ11 CARB STEEL (BLADE) ×3 IMPLANT
CANNULA REDUC XI 12-8 STAPL (CANNULA) ×2
CANNULA REDUCER 12-8 DVNC XI (CANNULA) ×3 IMPLANT
CHLORAPREP W/TINT 26 (MISCELLANEOUS) ×6 IMPLANT
CLIP APPLIE 5 13 M/L LIGAMAX5 (MISCELLANEOUS) IMPLANT
CLIP APPLIE ROT 10 11.4 M/L (STAPLE) IMPLANT
COVER SURGICAL LIGHT HANDLE (MISCELLANEOUS) ×3 IMPLANT
COVER TIP SHEARS 8 DVNC (MISCELLANEOUS) ×3 IMPLANT
COVER TIP SHEARS 8MM DA VINCI (MISCELLANEOUS) ×2
DERMABOND ADVANCED .7 DNX12 (GAUZE/BANDAGES/DRESSINGS) ×3 IMPLANT
DRAPE ARM DVNC X/XI (DISPOSABLE) ×12 IMPLANT
DRAPE COLUMN DVNC XI (DISPOSABLE) ×3 IMPLANT
DRAPE DA VINCI XI ARM (DISPOSABLE) ×8
DRAPE DA VINCI XI COLUMN (DISPOSABLE) ×2
ELECT REM PT RETURN 15FT ADLT (MISCELLANEOUS) ×3 IMPLANT
GLOVE BIO SURGEON STRL SZ7.5 (GLOVE) ×6 IMPLANT
GLOVE INDICATOR 8.0 STRL GRN (GLOVE) ×6 IMPLANT
GOWN STRL REUS W/ TWL XL LVL3 (GOWN DISPOSABLE) ×9 IMPLANT
GOWN STRL REUS W/TWL XL LVL3 (GOWN DISPOSABLE) ×6
GRASPER SUT TROCAR 14GX15 (MISCELLANEOUS) ×3 IMPLANT
IRRIG SUCT STRYKERFLOW 2 WTIP (MISCELLANEOUS) ×2
IRRIGATION SUCT STRKRFLW 2 WTP (MISCELLANEOUS) ×3 IMPLANT
KIT BASIN OR (CUSTOM PROCEDURE TRAY) ×3 IMPLANT
KIT GASTRIC LAVAGE 34FR ADT (SET/KITS/TRAYS/PACK) ×3 IMPLANT
KIT TURNOVER KIT A (KITS) IMPLANT
LUBRICANT JELLY K Y 4OZ (MISCELLANEOUS) IMPLANT
MARKER SKIN DUAL TIP RULER LAB (MISCELLANEOUS) ×3 IMPLANT
MAT PREVALON FULL STRYKER (MISCELLANEOUS) ×3 IMPLANT
NDL SPNL 18GX3.5 QUINCKE PK (NEEDLE) ×3 IMPLANT
NEEDLE SPNL 18GX3.5 QUINCKE PK (NEEDLE) ×2 IMPLANT
OBTURATOR OPTICAL STANDARD 8MM (TROCAR) ×2
OBTURATOR OPTICAL STND 8 DVNC (TROCAR) ×2
OBTURATOR OPTICALSTD 8 DVNC (TROCAR) ×3 IMPLANT
PACK CARDIOVASCULAR III (CUSTOM PROCEDURE TRAY) ×3 IMPLANT
RELOAD STAPLE 60 2.5 WHT DVNC (STAPLE) ×3 IMPLANT
RELOAD STAPLE 60 3.5 BLU DVNC (STAPLE) IMPLANT
RELOAD STAPLER 2.5X60 WHT DVNC (STAPLE) ×16 IMPLANT
RELOAD STAPLER 3.5X60 BLU DVNC (STAPLE) IMPLANT
SEAL CANN UNIV 5-8 DVNC XI (MISCELLANEOUS) ×9 IMPLANT
SEAL XI 5MM-8MM UNIVERSAL (MISCELLANEOUS) ×6
SEALER VESSEL DA VINCI XI (MISCELLANEOUS) ×2
SEALER VESSEL EXT DVNC XI (MISCELLANEOUS) ×3 IMPLANT
SET TUBE SMOKE EVAC HIGH FLOW (TUBING) ×3 IMPLANT
SOL ANTI FOG 6CC (MISCELLANEOUS) ×3 IMPLANT
SOLUTION ANTI FOG 6CC (MISCELLANEOUS) ×2
SOLUTION ELECTROLUBE (MISCELLANEOUS) ×3 IMPLANT
SPIKE FLUID TRANSFER (MISCELLANEOUS) ×3 IMPLANT
STAPLER 60 DA VINCI SURE FORM (STAPLE) ×2
STAPLER 60 SUREFORM DVNC (STAPLE) ×3 IMPLANT
STAPLER CANNULA SEAL DVNC XI (STAPLE) ×3 IMPLANT
STAPLER CANNULA SEAL XI (STAPLE) ×2
STAPLER RELOAD 2.5X60 WHITE (STAPLE) ×16
STAPLER RELOAD 2.5X60 WHT DVNC (STAPLE) ×16
STAPLER RELOAD 3.5X60 BLU DVNC (STAPLE)
STAPLER RELOAD 3.5X60 BLUE (STAPLE)
SUT ETHIBOND 0 36 GRN (SUTURE) IMPLANT
SUT MNCRL AB 4-0 PS2 18 (SUTURE) ×3 IMPLANT
SUT SILK 2 0 SH (SUTURE) ×6 IMPLANT
SUT V-LOC BARB 180 2/0GR6 GS22 (SUTURE) ×4
SUT VIC AB 2-0 SH 27 (SUTURE)
SUT VIC AB 2-0 SH 27XBRD (SUTURE) IMPLANT
SUT VICRYL 0 TIES 12 18 (SUTURE) ×3 IMPLANT
SUT VLOC BARB 180 ABS3/0GR12 (SUTURE) ×4
SUTURE V-LC BRB 180 2/0GR6GS22 (SUTURE) ×6 IMPLANT
SUTURE VLOC BRB 180 ABS3/0GR12 (SUTURE) ×6 IMPLANT
SYR 20ML LL LF (SYRINGE) ×3 IMPLANT
TOWEL OR 17X26 10 PK STRL BLUE (TOWEL DISPOSABLE) ×3 IMPLANT
TRAY FOLEY MTR SLVR 16FR STAT (SET/KITS/TRAYS/PACK) IMPLANT
TROCAR ADV FIXATION 12X100MM (TROCAR) IMPLANT
TROCAR Z-THREAD FIOS 5X100MM (TROCAR) ×3 IMPLANT
TUBE CALIBRATION LAPBAND (TUBING) IMPLANT

## 2022-08-31 NOTE — Anesthesia Postprocedure Evaluation (Signed)
Anesthesia Post Note  Patient: Lindsey Beasley  Procedure(s) Performed: ROBOTIC GASTRIC BY-PASS FOUX-EN-Y WITH UPPER ENDO (Abdomen) LAPAROSCOPIC REPAIR OF HIATAL HERNIA (Esophagus)     Patient location during evaluation: PACU Anesthesia Type: General Level of consciousness: awake and alert and oriented Pain management: pain level controlled Vital Signs Assessment: post-procedure vital signs reviewed and stable Respiratory status: spontaneous breathing, nonlabored ventilation and respiratory function stable Cardiovascular status: blood pressure returned to baseline and stable Postop Assessment: no apparent nausea or vomiting Anesthetic complications: no   No notable events documented.  Last Vitals:  Vitals:   08/31/22 1115 08/31/22 1130  BP: (!) 144/78 (!) 153/88  Pulse: 77 80  Resp: 14 16  Temp:    SpO2: (!) 87% 99%    Last Pain:  Vitals:   08/31/22 1130  PainSc: 7                  Chason Mciver A.

## 2022-08-31 NOTE — Anesthesia Procedure Notes (Signed)
Procedure Name: Intubation Date/Time: 08/31/2022 7:54 AM  Performed by: Lavina Hamman, CRNAPre-anesthesia Checklist: Patient identified, Emergency Drugs available, Suction available, Patient being monitored and Timeout performed Patient Re-evaluated:Patient Re-evaluated prior to induction Oxygen Delivery Method: Circle system utilized Preoxygenation: Pre-oxygenation with 100% oxygen Induction Type: IV induction Ventilation: Mask ventilation without difficulty Laryngoscope Size: Mac and 3 Grade View: Grade II Tube type: Oral Tube size: 7.0 mm Number of attempts: 1 Airway Equipment and Method: Stylet Placement Confirmation: ETT inserted through vocal cords under direct vision, positive ETCO2, CO2 detector and breath sounds checked- equal and bilateral Secured at: 21 cm Tube secured with: Tape Dental Injury: Teeth and Oropharynx as per pre-operative assessment  Comments: ATOI

## 2022-08-31 NOTE — Op Note (Signed)
Patient: Evangelene Vora (December 16, 1965, [id number])  Date of Surgery: 08/31/2022   Preoperative Diagnosis: MORBID OBESITY   Postoperative Diagnosis: MORBID OBESITY   Surgical Procedure: ROBOTIC GASTRIC BY-PASS FOUX-EN-Y WITH UPPER ENDO: 70623 (CPT) LAPAROSCOPIC REPAIR OF HIATAL HERNIA: 43281 (CPT)   Operative Team Members:  Surgeon(s) and Role:    * Ronan Dion, Hyman Hopes, MD - Primary    * Luretha Murphy, MD - Assisting   Anesthesiologist: Mal Amabile, MD CRNA: [place], Stephanie C, CRNA; Key, Kristopher, CRNA; Nelle Don, CRNA   Anesthesia: General   Fluids:  Total I/O In: 900 [I.V.:800; IV Piggyback:100] Out: 25 [Blood:25]  Complications: * No complications entered in OR log *  Drains:  none   Specimen: * No specimens in log *   Disposition:  PACU - hemodynamically stable.  Plan of Care: Admit to inpatient     Indications for Procedure: Jenessa Gillingham is a 56 y.o. female who is seen as an office consultation for bariatric surgery consultation. The patient has morbid obesity with a BMI of Body mass index is 42 kg/m. and the following conditions related to obesity: Type 2 diabetes, hypertension, hiatal hernia.  We discussed the surgical options to treat obesity and its associated comorbidity. After discussing the available procedures in the region, we discussed in great detail the surgeries I offer: robotic sleeve gastrectomy and robotic roux-en-y gastric bypass. We discussed the procedures themselves as well as their risks, benefits and alternatives. I entered the patient's basic information into the [facility] Metabolic Surgery Risk/Benefit Calculator to facilitate this discussion. I also entered the patient's information into the [facility] Individualized Metabolic Surgery Score calculator to help explain surgery's effect on Type 2 Diabetes.   After a full discussion and all questions answered, the patient is interested in  pursuing a robotic gastric bypass.  She completed the bariatric surgery preoperative pathway to include the following: - Bloodwork - Dietician consult - Chest x-ray - EKG - Psychology evaluation - Physical therapy - Upper endoscopy with biopsyj.   Today she presents for robotic gastric bypass with hiatal hernia repair and upper endoscopy.  We discussed the risks, benefits and alternatives and the patient granted consent to proceed.  We will proceed as scheduled.    Findings: Small hiatal hernia  Infection status: Patient: Private Patient Elective Case Case: Elective Infection Present At Time Of Surgery (PATOS): Some spillage of foregut  and jejunal contents while creating anastomoses   Description of Procedure:   On the date stated above, the patient was taken to the operating room suite and placed in supine positioning.  General endotracheal anesthesia was induced.  A timeout was completed verifying the correct patient, procedure, positioning and equipment needed for the case.  The patient's abdomen was prepped and draped in the usual sterile fashion.  A 5 mm trocar was used to enter the right upper quadrant using optical technique.  The abdomen was entered safely without any trauma the underlying viscera.  Three additional incisions were made and 4 robotic trochars were placed across the abdomen, replacing the 5 mm trocar with the 12 mm robotic stapler trocar.  The [facility] liver retractor was placed through the subxiphoid region and under the left lobe of the liver and was connected to the rail of the bed.  A TAP block was placed using marcaine and Exparel under direct vision of the laparoscope.  The [employer] XI robotic platform was docked and we transitioned to robotic surgery.  A hiatal hernia was identified  and repaired.    The gastrohepatic ligament was divided and the right crus was identified.  A blunt dissection was carried out between the right crus and the esophagus.  The  phrenoesophageal ligament was divided and dissection was carried up over the top of the esophagus towards the left crus.  The fundus was partially mobilized off of the left hemidiaphragm.    All posterior gastric attachments to the lesser sac and retroperitoneum were divided.  The left crus was further delineated.  A retroesophageal window was created and used to for esophageal retraction.    A high, circumferential mediastinal dissection was performed in an effort to mobilize the esophagus and provide for adequate intraabdominal esophageal length.  The mediastinal dissection was performed bluntly, with little to no thermal energy.  The anterior and posterior vagus nerves were both identified and preserved.  The crural defect was reapproximated with two, interrupted, 0 silk sutures.  The crural pillars came together well without tearing of the adjacent diaphragmatic tissue.    Using the tip up grasper, fenestrated bipolar, 30 degree camera and Vessel Sealer from the patient's right to left, we began by dissecting the angle of His off the left crus of the diaphragm.  The adhesions between the stomach, spleen and diaphragm were divided using the Vessel Sealer to define the angle of His.  I then started 4-6 cm down on the lesser curve of the stomach and created a defect in the gastrohepatic ligament tracking behind the lesser curve of the stomach to enter the lesser sac.  I then used multiple white loads of the robotic 60 mm Sureform linear stapler to form the gastric pouch.  I then directed my attention to the lower abdomen.  The omentum was divided with the Vessel Sealer and I identified the ligament of treitz.  The jejunum was run to a point 50 cm from the ligament of Treitz.  This loop of bowel was then brought into the left upper quadrant, over the transverse colon, between the split omentum.  A 2-0 v-loc suture was used to create the posterior outer row of the gastrojejunal anastomosis.  An approximately  2 cm gastrotomy was made in the pouch and a matching 2 cm enterotomy was created in the roux limb.  Then, two 3-0 v-loc sutures were used to create a posterior, inner, full thickness layer of the anastomosis.  While sewing the anterior inner layer, the Ewald tube was passed through the anastomosis to ensure patency.  The outer, anterior layer was then created using 2-0 v-loc suture.  A window was made in the jejunal mesentery and the jejunum was divided just proximal to the gastrojejunal anastomosis using a white load of the robotic 60 mm Sureform linear stapler to divide the roux limb from the hepatobiliary limb.  At this point the gastrojejunal anastomosis was complete and the Ewald tube was removed.  The roux limb was then run 100 cm from the gastrojejunal anastomosis.  This loop of bowel was brought into the left upper quadrant for anastomosis to the hepatobiliary limb.  A robotic 60 mm white load on the Sureform linear stapler was introduced into both limbs and fired to create the jejunojejunostomy.  A second 60 mm white load of the Sureform linear stapler was fired to connect the distal Roux limb to the proximal common channel creating a W shaped anastomosis.  The common enterotomy was closed with a final 60 mm white load of the Sureform linear stapler.  The jejunojejunostomy mesenteric defect  was closed using running 0-Ethibond suture.   The retro-roux defect was closed, closing the roux limb mesentery to the transverse mesocolon mesentery using a running 0-Ethibond suture.   I ran the roux limb from the gastrojejunal anastomosis to the jejunojejunostomy and found the anatomy as expected without any twisting of the mesentery.  The intra-abdominal pressure was decreased to 74mmHg for the remainder of the case to check for hemostasis.  I then transitioned to endoscopic portion of the procedure.  The adult upper endoscope was inserted into the pouch.  The pouch appeared appropriately sized.  There was good  intra-luminal hemostasis.  The endoscope was inserted through the anastomosis into the roux limb.  The anastomosis appeared well formed without any stricture.  The anastomosis was submerged in irrigation in the left upper quadrant and there was no leakage of air bubbles with endoscopic insufflation suggesting a negative leak test and an air tight anastomosis.    The foregut was decompressed with the endoscope and the endoscope was removed.  The robotic instruments were removed and the robot was undocked.  The stapler port in the right abdomen was closed at the fascial level using 0-vicryl on a PMI suture passer.  The liver retractor was removed under direct vision.  The pneumoperitoneum was evacuated.  The skin was closed using 4-0 Monocryl and Dermabond.  All sponge and needle counts were correct at the end of the case.    Louanna Raw, MD General, Bariatric, & Minimally Invasive Surgery [facility], [place]

## 2022-08-31 NOTE — H&P (Signed)
Admitting Physician: Nickola Major Joua Bake  Service: Bariatric surgery  CC: Obesity  Subjective   HPI: Lindsey Beasley is an 56 y.o. female who is here for robotic gastric bypass  Past Medical History:  Diagnosis Date   Baker's cyst 2021   Right knee   Diabetes mellitus without complication ([place])    History of hiatal hernia    Hypertension     Past Surgical History:  Procedure Laterality Date   BIOPSY  05/04/2022   Procedure: BIOPSY;  Surgeon: Felicie Morn, MD;  Location: [facility];  Service: General;;   BREAST BIOPSY Left    [place]   COLONOSCOPY     ESOPHAGOGASTRODUODENOSCOPY N/A 05/04/2022   Procedure: ESOPHAGOGASTRODUODENOSCOPY (EGD);  Surgeon: Felicie Morn, MD;  Location: [facility];  Service: General;  Laterality: N/A;    Family History  Problem Relation Age of Onset   Diabetes Mother    Kidney failure Mother    High blood pressure Mother    Parkinson's disease Father    Breast cancer Cousin     Social:  reports that she quit smoking about 6 years ago. Her smoking use included cigarettes. She has never used smokeless tobacco. She reports that she does not currently use alcohol. She reports that she does not use drugs.  Allergies:  Allergies  Allergen Reactions   Bee Venom Anaphylaxis   Lisinopril Cough   Penicillins     Unknown Reaction - family reaction (mother)    Medications: Current Outpatient Medications  Medication Instructions   acetaminophen (TYLENOL) 500-1,000 mg, Oral, Every 6 hours PRN   amLODipine (NORVASC) 10 mg, Oral, Every morning   atorvastatin (LIPITOR) 20 mg, Oral, Every morning   cetirizine (ZYRTEC) 10 mg, Oral, Daily PRN   glimepiride (AMARYL) 8 mg, Oral, Every morning   losartan (COZAAR) 100 mg, Oral, Every morning   nebivolol (BYSTOLIC) 10 mg, Oral, Every morning   Omega-3 Fatty Acids (OMEGA 3 PO) 1 capsule, Oral, Every morning, OmegaXL Joint Pain Relief & Inflammation  Supplement   Polyethyl Glycol-Propyl Glycol (LUBRICANT EYE DROPS) 0.4-0.3 % SOLN 1-2 drops, Both Eyes, 3 times daily PRN   Semaglutide 14 mg, Oral, Every morning    ROS - all of the below systems have been reviewed with the patient and positives are indicated with bold text General: chills, fever or night sweats Eyes: blurry vision or double vision ENT: epistaxis or sore throat Allergy/Immunology: itchy/watery eyes or nasal congestion Hematologic/Lymphatic: bleeding problems, blood clots or swollen lymph nodes Endocrine: temperature intolerance or unexpected weight changes Breast: new or changing breast lumps or nipple discharge Resp: cough, shortness of breath, or wheezing CV: chest pain or dyspnea on exertion GI: as per HPI GU: dysuria, trouble voiding, or hematuria MSK: joint pain or joint stiffness Neuro: TIA or stroke symptoms Derm: pruritus and skin lesion changes Psych: anxiety and depression  Objective   PE Blood pressure (!) 175/88, pulse 77, temperature 98.3 F (36.8 C), resp. rate 16, height 5\' 6"  (1.676 m), weight 120.3 kg, last menstrual period 08/15/2020, SpO2 100 %. Constitutional: NAD; conversant; no deformities Eyes: Moist conjunctiva; no lid lag; anicteric; PERRL Neck: Trachea midline; no thyromegaly Lungs: Normal respiratory effort; no tactile fremitus CV: RRR; no palpable thrills; no pitting edema GI: Abd Soft, nontender; no palpable hepatosplenomegaly MSK: Normal range of motion of extremities; no clubbing/cyanosis Psychiatric: Appropriate affect; alert and oriented x3 Lymphatic: No palpable cervical or axillary lymphadenopathy  Results for orders placed or performed during  the hospital encounter of 08/31/22 (from the past 24 hour(s))  ABO/Rh     Status: None (Preliminary result)   Collection Time: 08/31/22  6:20 AM  Result Value Ref Range   ABO/RH(D) PENDING   Glucose, capillary     Status: Abnormal   Collection Time: 08/31/22  6:43 AM  Result Value  Ref Range   Glucose-Capillary 152 (H) 70 - 99 mg/dL    Imaging Orders  No imaging studies ordered today     Assessment and Plan    Lindsey Beasley is a 56 y.o. female who is seen as an office consultation for bariatric surgery consultation. The patient has morbid obesity with a BMI of Body mass index is 42 kg/m. and the following conditions related to obesity: Type 2 diabetes, hypertension, hiatal hernia.  We discussed the surgical options to treat obesity and its associated comorbidity. After discussing the available procedures in the region, we discussed in great detail the surgeries I offer: robotic sleeve gastrectomy and robotic roux-en-y gastric bypass. We discussed the procedures themselves as well as their risks, benefits and alternatives. I entered the patient's basic information into the [facility] Metabolic Surgery Risk/Benefit Calculator to facilitate this discussion. I also entered the patient's information into the [facility] Individualized Metabolic Surgery Score calculator to help explain surgery's effect on Type 2 Diabetes.   After a full discussion and all questions answered, the patient is interested in pursuing a robotic gastric bypass.  She completed the bariatric surgery preoperative pathway to include the following: - Bloodwork - Dietician consult - Chest x-ray - EKG - Psychology evaluation - Physical therapy - Upper endoscopy with biopsyj.  Today she presents for robotic gastric bypass with hiatal hernia repair and upper endoscopy.  We discussed the risks, benefits and alternatives and the patient granted consent to proceed.  We will proceed as scheduled.  Quentin Ore, MD  [facility] Use [email or web address] to contact on call provider

## 2022-08-31 NOTE — Transfer of Care (Signed)
Immediate Anesthesia Transfer of Care Note  Patient: Lindsey Beasley  Procedure(s) Performed: ROBOTIC GASTRIC BY-PASS FOUX-EN-Y WITH UPPER ENDO (Abdomen) LAPAROSCOPIC REPAIR OF HIATAL HERNIA (Esophagus)  Patient Location: PACU  Anesthesia Type:General  Level of Consciousness: awake, alert  and oriented  Airway & Oxygen Therapy: Patient Spontanous Breathing and Patient connected to face mask oxygen  Post-op Assessment: Report given to RN, Post -op Vital signs reviewed and stable and Patient moving all extremities X 4  Post vital signs: Reviewed and stable  Last Vitals:  Vitals Value Taken Time  BP 155/77   Temp    Pulse 77 08/31/22 1046  Resp 13 08/31/22 1046  SpO2 94 % 08/31/22 1046  Vitals shown include unvalidated device data.  Last Pain:  Vitals:   08/31/22 0650  PainSc: 0-No pain         Complications: No notable events documented.

## 2022-08-31 NOTE — Progress Notes (Signed)
PHARMACY CONSULT FOR:  Risk Assessment for Post-Discharge VTE Following Bariatric Surgery  Post-Discharge VTE Risk Assessment: This patient's probability of 30-day post-discharge VTE is increased due to the factors marked: X Sleeve gastrectomy   Liver disorder (transplant, cirrhosis, or nonalcoholic steatohepatitis)   Hx of VTE   Hemorrhage requiring transfusion   GI perforation, leak, or obstruction   ====================================================    Female    Age >/=60 years    BMI >/=50 kg/m2    CHF    Dyspnea at Rest    Paraplegia  X  Non-gastric-band surgery    Operation Time >/=3 hr    Return to OR     Length of Stay >/= 3 d   Hypercoagulable condition   Significant venous stasis      Predicted probability of 30-day post-discharge VTE: 0.16%  Recommendation for Discharge: No pharmacologic prophylaxis post-discharge   Lindsey Beasley is a 56 y.o. female who underwent gastric by-pass foux-en-y and hernia repair on 08/31/22.   Case start: 0802 Case end: 1038   Allergies  Allergen Reactions   Bee Venom Anaphylaxis   Lisinopril Cough   Penicillins     Unknown Reaction - family reaction (mother)    Patient Measurements: Height: 5\' 6"  (167.6 cm) Weight: 120.3 kg (265 lb 3.4 oz) IBW/kg (Calculated) : 59.3 Body mass index is 42.81 kg/m.  No results for input(s): "WBC", "HGB", "HCT", "PLT", "APTT", "CREATININE", "LABCREA", "CREAT24HRUR", "MG", "PHOS", "ALBUMIN", "PROT", "AST", "ALT", "ALKPHOS", "BILITOT", "BILIDIR", "IBILI" in the last 72 hours. Estimated Creatinine Clearance: 103.8 mL/min (by C-G formula based on SCr of 0.45 mg/dL).    Past Medical History:  Diagnosis Date   Baker's cyst 2021   Right knee   Diabetes mellitus without complication ([place])    History of hiatal hernia    Hypertension      Medications Prior to Admission  Medication Sig Dispense Refill Last Dose   acetaminophen (TYLENOL) 500 MG tablet Take 500-1,000 mg by mouth  every 6 (six) hours as needed (pain.).   08/30/2022   amLODipine (NORVASC) 10 MG tablet Take 10 mg by mouth in the morning.   08/31/2022 at 0420   atorvastatin (LIPITOR) 20 MG tablet Take 20 mg by mouth in the morning.   08/31/2022 at 0420   cetirizine (ZYRTEC) 10 MG tablet Take 10 mg by mouth daily as needed for allergies.   Past Week   glimepiride (AMARYL) 4 MG tablet Take 8 mg by mouth in the morning.   08/30/2022   losartan (COZAAR) 100 MG tablet Take 100 mg by mouth in the morning.   08/30/2022   nebivolol (BYSTOLIC) 10 MG tablet Take 10 mg by mouth in the morning.   08/31/2022 at 0420   Omega-3 Fatty Acids (OMEGA 3 PO) Take 1 capsule by mouth in the morning. OmegaXL Joint Pain Relief & Inflammation Supplement   08/30/2022   Polyethyl Glycol-Propyl Glycol (LUBRICANT EYE DROPS) 0.4-0.3 % SOLN Place 1-2 drops into both eyes 3 (three) times daily as needed (dry/irritated eyes.).   Past Week   Semaglutide 14 MG TABS Take 14 mg by mouth in the morning.   08/30/2022       Dia Sitter P 08/31/2022,1:02 PM

## 2022-08-31 NOTE — Progress Notes (Signed)

## 2022-08-31 NOTE — Progress Notes (Signed)
Pt noted BP high-195/78. Notified on call Dr Georgette Dover via text page with new order received

## 2022-09-01 ENCOUNTER — Other Ambulatory Visit (HOSPITAL_COMMUNITY): Payer: Self-pay

## 2022-09-01 ENCOUNTER — Encounter (HOSPITAL_COMMUNITY): Payer: Self-pay | Admitting: Surgery

## 2022-09-01 LAB — CBC WITH DIFFERENTIAL/PLATELET
Abs Immature Granulocytes: 0.06 10*3/uL (ref 0.00–0.07)
Basophils Absolute: 0 10*3/uL (ref 0.0–0.1)
Basophils Relative: 0 %
Eosinophils Absolute: 0 10*3/uL (ref 0.0–0.5)
Eosinophils Relative: 0 %
HCT: 39.9 % (ref 36.0–46.0)
Hemoglobin: 12.7 g/dL (ref 12.0–15.0)
Immature Granulocytes: 0 %
Lymphocytes Relative: 24 %
Lymphs Abs: 3.3 10*3/uL (ref 0.7–4.0)
MCH: 27.3 pg (ref 26.0–34.0)
MCHC: 31.8 g/dL (ref 30.0–36.0)
MCV: 85.8 fL (ref 80.0–100.0)
Monocytes Absolute: 1.2 10*3/uL — ABNORMAL HIGH (ref 0.1–1.0)
Monocytes Relative: 9 %
Neutro Abs: 9.3 10*3/uL — ABNORMAL HIGH (ref 1.7–7.7)
Neutrophils Relative %: 67 %
Platelets: 316 10*3/uL (ref 150–400)
RBC: 4.65 MIL/uL (ref 3.87–5.11)
RDW: 13.3 % (ref 11.5–15.5)
WBC: 13.8 10*3/uL — ABNORMAL HIGH (ref 4.0–10.5)
nRBC: 0 % (ref 0.0–0.2)

## 2022-09-01 LAB — GLUCOSE, CAPILLARY
Glucose-Capillary: 172 mg/dL — ABNORMAL HIGH (ref 70–99)
Glucose-Capillary: 132 mg/dL — ABNORMAL HIGH (ref 70–99)

## 2022-09-01 MED ORDER — ACETAMINOPHEN 500 MG PO TABS: 1000.0000 mg | ORAL_TABLET | Freq: Three times a day (TID) | ORAL | 0 refills | Status: AC

## 2022-09-01 MED ORDER — GABAPENTIN 100 MG PO CAPS
200.0000 mg | ORAL_CAPSULE | Freq: Two times a day (BID) | ORAL | 0 refills | Status: AC
  Filled 2022-09-01: qty 20, 5d supply, fill #0

## 2022-09-01 MED ORDER — OXYCODONE HCL 5 MG PO TABS
5.0000 mg | ORAL_TABLET | Freq: Four times a day (QID) | ORAL | 0 refills | Status: AC | PRN
  Filled 2022-09-01: qty 10, 3d supply, fill #0

## 2022-09-01 MED ORDER — ONDANSETRON 4 MG PO TBDP
4.0000 mg | ORAL_TABLET | Freq: Four times a day (QID) | ORAL | 0 refills | Status: AC | PRN
  Filled 2022-09-01: qty 20, 5d supply, fill #0

## 2022-09-01 MED ORDER — PANTOPRAZOLE SODIUM 40 MG PO TBEC
40.0000 mg | DELAYED_RELEASE_TABLET | Freq: Every day | ORAL | 0 refills | Status: AC
  Filled 2022-09-01: qty 90, 90d supply, fill #0

## 2022-09-01 NOTE — Progress Notes (Signed)
Patient was given discharge instructions, and all questions were answered.  Patient was stable for discharge and was taken to the main exit by wheelchair. 

## 2022-09-01 NOTE — Discharge Summary (Signed)
Patient ID: Lindsey Beasley [id number] 56 y.o. [birth year]  08/31/2022  Discharge date and time: 09/01/2022  Admitting Physician: Hyman Hopes Camaron Cammack  Discharge Physician: Hyman Hopes Cataleah Stites  Admission Diagnoses: Morbid obesity with BMI of 40.0-44.9, adult (HCC) [E66.01, Z68.41] Patient Active Problem List   Diagnosis Date Noted   Morbid obesity with BMI of 40.0-44.9, adult (HCC) 08/31/2022     Discharge Diagnoses:  Patient Active Problem List   Diagnosis Date Noted   Morbid obesity with BMI of 40.0-44.9, adult (HCC) 08/31/2022    Operations: Procedure(s): ROBOTIC GASTRIC BY-PASS FOUX-EN-Y WITH UPPER ENDO LAPAROSCOPIC REPAIR OF HIATAL HERNIA  Admission Condition: good  Discharged Condition: good  Indication for Admission: Obesity  Hospital Course: Robotic hiatal hernia repair and gastric bypass 08/31/22.    Consults: None  Significant Diagnostic Studies: None  Treatments: surgery: as above  Disposition: Home  Patient Instructions:  Allergies as of 09/01/2022       Reactions   Bee Venom Anaphylaxis   Lisinopril Cough   Penicillins    Unknown Reaction - family reaction (mother)        Medication List     TAKE these medications    acetaminophen 500 MG tablet Commonly known as: TYLENOL Take 500-1,000 mg by mouth every 6 (six) hours as needed (pain.). What changed: Another medication with the same name was added. Make sure you understand how and when to take each.   acetaminophen 500 MG tablet Commonly known as: TYLENOL Take 2 tablets (1,000 mg total) by mouth every 8 (eight) hours for 5 days. What changed: You were already taking a medication with the same name, and this prescription was added. Make sure you understand how and when to take each.   amLODipine 10 MG tablet Commonly known as: NORVASC Take 10 mg by mouth in the morning. Notes to patient: Monitor Blood Pressure Daily and keep a log for primary care physician.  You may need  to make changes to your medications with rapid weight loss.     atorvastatin 20 MG tablet Commonly known as: LIPITOR Take 20 mg by mouth in the morning.   cetirizine 10 MG tablet Commonly known as: ZYRTEC Take 10 mg by mouth daily as needed for allergies.   gabapentin 100 MG capsule Commonly known as: NEURONTIN Take 2 capsules (200 mg total) by mouth every 12 (twelve) hours.   glimepiride 4 MG tablet Commonly known as: AMARYL Take 8 mg by mouth in the morning. Notes to patient: Monitor Blood Sugar Frequently and keep a log for primary care physician, you may need to adjust medication dosage with rapid weight loss.     losartan 100 MG tablet Commonly known as: COZAAR Take 100 mg by mouth in the morning. Notes to patient: Monitor Blood Pressure Daily and keep a log for primary care physician.  You may need to make changes to your medications with rapid weight loss.     Lubricant Eye Drops 0.4-0.3 % Soln Generic drug: Polyethyl Glycol-Propyl Glycol Place 1-2 drops into both eyes 3 (three) times daily as needed (dry/irritated eyes.).   nebivolol 10 MG tablet Commonly known as: BYSTOLIC Take 10 mg by mouth in the morning. Notes to patient: Monitor Blood Pressure Daily and keep a log for primary care physician.  You may need to make changes to your medications with rapid weight loss.     OMEGA 3 PO Take 1 capsule by mouth in the morning. OmegaXL Joint Pain Relief & Inflammation Supplement   ondansetron 4  MG disintegrating tablet Commonly known as: ZOFRAN-ODT Take 1 tablet (4 mg total) by mouth every 6 (six) hours as needed for nausea or vomiting.   oxyCODONE 5 MG immediate release tablet Commonly known as: Oxy IR/ROXICODONE Take 1 tablet (5 mg total) by mouth every 6 (six) hours as needed for severe pain.   pantoprazole 40 MG tablet Commonly known as: PROTONIX Take 1 tablet (40 mg total) by mouth daily.   Semaglutide 14 MG Tabs Take 14 mg by mouth in the morning. Notes to  patient: Monitor Blood Sugar Frequently and keep a log for primary care physician, you may need to adjust medication dosage with rapid weight loss.          Activity: no heavy lifting for 4 weeks Diet:  Bariatric diet protocol Wound Care: keep wound clean and dry  Follow-up:  With Dr. Thermon Leyland  Signed: Nickola Major Olivea Sonnen General, Bariatric, & Minimally Invasive Surgery [facility], [place]   09/01/2022, 11:13 AM

## 2022-09-01 NOTE — TOC Progression Note (Signed)
Transition of Care [facility]) - Progression Note    Patient Details  Name: Loriana Samad MRN: [id number] Date of Birth: 1966-06-07  Transition of Care [facility]) CM/SW Contact  Henrietta Dine, RN Phone Number: 09/01/2022, 9:44 AM  Clinical Narrative:     Transition of Care [facility]) Screening Note   Patient Details  Name: Bryanna Yim Date of Birth: January 19, 1966   Transition of Care [facility]) CM/SW Contact:    Henrietta Dine, RN Phone Number: 09/01/2022, 9:44 AM    Transition of Care Department [facility]) has reviewed patient and no TOC needs have been identified at this time. We will continue to monitor patient advancement through interdisciplinary progression rounds. If new patient transition needs arise, please place a TOC consult.          Expected Discharge Plan and Services                                                 Social Determinants of Health (SDOH) Interventions    Readmission Risk Interventions     No data to display

## 2022-09-01 NOTE — Discharge Instructions (Signed)

## 2022-09-01 NOTE — Progress Notes (Signed)
Patient alert and oriented, pain is controlled. Patient is tolerating fluids, advanced to protein shake today, patient is tolerating well. Reviewed Gastric sleeve discharge instructions with patient and patient is able to articulate understanding. Provided information on BELT program, Support Group and [facility] outpatient pharmacy. Communicated general update of patient status to surgeon. All questions answered. 24hr fluid recall is 480 per hydration protocol, bariatric nurse coordinator to make follow-up phone call within one week.

## 2022-09-01 NOTE — Inpatient Diabetes Management (Addendum)
Inpatient Diabetes Program Recommendations  AACE/ADA: New Consensus Statement on Inpatient Glycemic Control (2015)  Target Ranges:  Prepandial:   less than 140 mg/dL      Peak postprandial:   less than 180 mg/dL (1-2 hours)      Critically ill patients:  140 - 180 mg/dL   Lab Results  Component Value Date   GLUCAP 172 (H) 09/01/2022   HGBA1C 7.4 (H) 08/18/2022    Review of Glycemic Control  Diabetes history: DM2 Outpatient Diabetes medications: Amaryl 8 mg QAM, Rybelsus 14 mg QAM Current orders for Inpatient glycemic control: Novolog 0-15 units TID with meals and 0-5 HS  HgbA1C - 7.4% !st day post-op bariatric surgery CBG 172 this am. CBGs in 200s yesterday afternoon   Spoke with pt at bedside. Has f/u appt with Endo on 09/14/22. Endo instructed to resume her Rybelsus after surgery. Pt has glucose meter at home and instructed to monitor blood sugars at least 4x/day. If CBG < 70 mg/dL, treat with 4 oz juice and recheck in 15 min. Pt voices understanding.  Inpatient Diabetes Program Recommendations:    Check with PCP/Endo regarding when to restart GLP-1 Rybelsus after surgery.   Monitor blood sugars at least 3-4x/day.  May need small amount of Novolog s/s after surgery if blood sugars consistently > 180 mg/dL.  Consider discontinuing Amaryl 8 mg QAM  Continue to follow. Will plan to see pt after lunch today.  Thank you. Lorenda Peck, RD, LDN, [place] Inpatient Diabetes Coordinator [phone]

## 2022-09-03 ENCOUNTER — Other Ambulatory Visit: Payer: Self-pay | Admitting: Obstetrics and Gynecology

## 2022-09-03 DIAGNOSIS — Z1231 Encounter for screening mammogram for malignant neoplasm of breast: Secondary | ICD-10-CM

## 2022-09-07 ENCOUNTER — Telehealth (HOSPITAL_COMMUNITY): Payer: Self-pay | Admitting: *Deleted

## 2022-09-07 NOTE — Telephone Encounter (Signed)
1.  Tell me about your pain and pain management? Pt denies any pain and just taking Tylenol.  2.  Let's talk about fluid intake.  How much total fluid are you taking in? Pt states that she is getting in at least 64oz of fluid including protein shakes, bottled water, broth and jello.  3.  How much protein have you taken in the last 2 days? Pt states she is meeting her goal of 60g of protein each day with the protein shake.  4.  Have you had nausea?  Tell me about when have experienced nausea and what you did to help? Pt denies nausea.   5.  Has the frequency or color changed with your urine? Pt states that she is urinating "fine" with no changes in frequency or urgency.     6.  Tell me what your incisions look like? "Incisions look fine". Pt denies a fever, chills.  Pt states incisions are not swollen, open, or draining.  Pt encouraged to call [facility] if incisions change.   7.  Have you been passing gas? BM? Pt states that she is having BMs. Last BM 09/06/22.     8.  If a problem or question were to arise who would you call?  Do you know contact numbers for [facility], [facility], and [facility]? Pt denies dehydration symptoms.  Pt can describe s/sx of dehydration.  Pt knows to call [facility] for surgical, [facility] for nutrition, and [facility] for non-urgent questions or concerns.   9.  How has the walking going? Pt states she is walking around and able to be active without difficulty.   10. Are you still using your incentive spirometer?  If so, how often? Pt states that she is doing the I.S.   Pt encouraged to use incentive spirometer, at least 10x every hour while awake until she sees the surgeon.  11.  How are your vitamins and calcium going?  How are you taking them? Pt states that she is taking her supplements and vitamins without difficulty.  Reminded patient that the first 30 days post-operatively are important for successful recovery.  Practice good hand hygiene, wearing a mask when appropriate (since optional in  most places), and minimizing exposure to people who live outside of the home, especially if they are exhibiting any respiratory, GI, or illness-like symptoms.

## 2022-09-14 ENCOUNTER — Encounter: Payer: Federal, State, Local not specified - PPO | Attending: Surgery | Admitting: Dietician

## 2022-09-14 ENCOUNTER — Encounter: Payer: Self-pay | Admitting: Dietician

## 2022-09-14 DIAGNOSIS — Z6841 Body Mass Index (BMI) 40.0 and over, adult: Secondary | ICD-10-CM | POA: Diagnosis not present

## 2022-09-14 DIAGNOSIS — E1169 Type 2 diabetes mellitus with other specified complication: Secondary | ICD-10-CM | POA: Insufficient documentation

## 2022-09-14 DIAGNOSIS — Z713 Dietary counseling and surveillance: Secondary | ICD-10-CM | POA: Diagnosis not present

## 2022-09-14 DIAGNOSIS — I10 Essential (primary) hypertension: Secondary | ICD-10-CM | POA: Insufficient documentation

## 2022-09-14 DIAGNOSIS — E785 Hyperlipidemia, unspecified: Secondary | ICD-10-CM | POA: Diagnosis not present

## 2022-09-14 DIAGNOSIS — E669 Obesity, unspecified: Secondary | ICD-10-CM

## 2022-09-14 NOTE — Progress Notes (Signed)
2 Week Post-Operative Nutrition Class   Patient was seen on 09/14/2022 for Post-Operative Nutrition education at [facility]    Surgery date: 08/31/2022 Surgery type: RYGB  Anthropometrics  Start weight at [facility]: 263 lbs (date: 02/24/2022)  Height: 66 in Weight today: 255.5 lbs. BMI: 41.24 kg/m2     Clinical  Medical hx: DM, HTN Medications: glimepiride, berberine, omega 3, vitamin B6, rybelsus, atorvastatin, amlodipine, bystolic   Labs: B0B 49.9, glucose 266, creatinine .52 Notable signs/symptoms: N/A Any previous deficiencies? No Bowel Habits: Every day to every other day, some constipation, using MoM, switching to Murelax.    Body Composition Scale 09/14/2022  Current Body Weight 255.5  Total Body Fat % 45.8  Visceral Fat 17  Fat-Free Mass % 54.1   Total Body Water % 41.5  Muscle-Mass lbs 31.8  BMI 41.2  Body Fat Displacement          Torso  lbs 72.6         Left Leg  lbs 14.5         Right Leg  lbs 14.5         Left Arm  lbs 7.2         Right Arm   lbs 7.2      The following the learning objectives were met by the patient during this course: Identifies Phase 3 (Soft, High Proteins) Dietary Goals and will begin from 2 weeks post-operatively to 2 months post-operatively Identifies appropriate sources of fluids and proteins  Identifies appropriate fat sources and healthy verses unhealthy fat types   States protein recommendations and appropriate sources post-operatively Identifies the need for appropriate texture modifications, mastication, and bite sizes when consuming solids Identifies appropriate fat consumption and sources Identifies appropriate multivitamin and calcium sources post-operatively Describes the need for physical activity post-operatively and will follow MD recommendations States when to call healthcare provider regarding medication questions or post-operative complications   Handouts given during class  include: Phase 3A: Soft, High Protein Diet Handout Phase 3 High Protein Meals Healthy Fats   Follow-Up Plan: Patient will follow-up at [facility] in 6 weeks for 2 month post-op nutrition visit for diet advancement per MD.

## 2022-09-21 ENCOUNTER — Telehealth: Payer: Self-pay | Admitting: Dietician

## 2022-09-21 NOTE — Telephone Encounter (Signed)
RD called pt to verify fluid intake once starting soft, solid proteins 2 week post-bariatric surgery.   Daily Fluid intake: 64 oz. Daily Protein intake: 55 grams from food, supplementing with shakes Bowel Habits: good  Concerns/issues:  Not chicken, but salmon, yogurt, peanut butter goes well.

## 2022-10-04 DIAGNOSIS — E119 Type 2 diabetes mellitus without complications: Secondary | ICD-10-CM | POA: Diagnosis not present

## 2022-10-04 DIAGNOSIS — H02051 Trichiasis without entropian right upper eyelid: Secondary | ICD-10-CM | POA: Diagnosis not present

## 2022-10-21 ENCOUNTER — Encounter: Payer: Self-pay | Admitting: Dietician

## 2022-10-21 ENCOUNTER — Encounter: Payer: Federal, State, Local not specified - PPO | Attending: Family Medicine | Admitting: Dietician

## 2022-10-21 VITALS — Ht 66.0 in | Wt 247.1 lb

## 2022-10-21 DIAGNOSIS — E669 Obesity, unspecified: Secondary | ICD-10-CM | POA: Diagnosis not present

## 2022-10-21 NOTE — Progress Notes (Signed)
Bariatric Nutrition Follow-Up Visit Medical Nutrition Therapy  Appt Start Time: 5:15   End Time: 5:50  Surgery date: 08/31/2022 Surgery type: RYGB  NUTRITION ASSESSMENT  Anthropometrics  Start weight at [facility]: 263 lbs (date: 02/24/2022)  Height: 66 in Weight today: 255.5 lbs. BMI: 39.88 kg/m2     Clinical  Medical hx: DM, HTN Medications: glimepiride, berberine, omega 3, vitamin B6, rybelsus, atorvastatin, amlodipine, bystolic   Labs: N6E 10.3, glucose 266, creatinine .52 Notable signs/symptoms: N/A Any previous deficiencies? No Bowel Habits: Every day to every other day, some constipation, using MoM, switching to Murelax.    Body Composition Scale 09/14/2022 10/21/2022  Current Body Weight 255.5 247.1  Total Body Fat % 45.8 45.0  Visceral Fat 17 16  Fat-Free Mass % 54.1 54.9   Total Body Water % 41.5 41.9  Muscle-Mass lbs 31.8 31.7  BMI 41.2 39.8  Body Fat Displacement           Torso  lbs 72.6 69.0         Left Leg  lbs 14.5 13.8         Right Leg  lbs 14.5 13.8         Left Arm  lbs 7.2 6.9         Right Arm   lbs 7.2 6.9     Lifestyle & Dietary Hx  Pt states she is seeing a lot of changes, stating she feels good and has a lot of energy.  Pt states she is not tolerating some foods, stating scrambled eggs make her nauseas.   Pt states she has not ventured past chicken, salmon, and tuna.   Estimated daily fluid intake: 64 oz Estimated daily protein intake: 50-60 g Supplements: multivitamin and calcium Current average weekly physical activity: walking 4 days a week for 30 minutes   24-Hr Dietary Recall First Meal: tea, water, yogurt or cottage cheese Snack:   Second Meal: chicken or [place] lunch meat, nuts Snack:   Third Meal: baked chicken, beans, black eye peas, lentils, lima beans Snack: beans or peanut butter Beverages: tea, water, sometimes flavorings for water  Post-Op Goals/ Signs/ Symptoms Using straws: no Drinking while eating:  no Chewing/swallowing difficulties: no Changes in vision: no Changes to mood/headaches: no Hair loss/changes to skin/nails: no Difficulty focusing/concentrating: no Sweating: no Limb weakness: no Dizziness/lightheadedness: no Palpitations: no  Carbonated/caffeinated beverages: no N/V/D/C/Gas: no Abdominal pain: no Dumping syndrome: no    NUTRITION DIAGNOSIS  Overweight/obesity ([place]-3.3) related to past poor dietary habits and physical inactivity as evidenced by completed bariatric surgery and following dietary guidelines for continued weight loss and healthy nutrition status.     NUTRITION INTERVENTION Nutrition counseling (C-1) and education (E-2) to facilitate bariatric surgery goals, including: Diet advancement to the next phase (phase 4) now including non-starchy vegetables The importance of consuming adequate calories as well as certain nutrients daily due to the body's need for essential vitamins, minerals, and fats The importance of daily physical activity and to reach a goal of at least 150 minutes of moderate to vigorous physical activity weekly (or as directed by their physician) due to benefits such as increased musculature and improved lab values The importance of intuitive eating specifically learning hunger-satiety cues and understanding the importance of learning a new body: The importance of mindful eating to avoid grazing behaviors   Handouts Provided Include  Phase 4 Food Plan  Learning Style & Readiness for Change Teaching method utilized: Visual & Auditory  Demonstrated degree of understanding via:  Teach Back  Readiness Level: Action Barriers to learning/adherence to lifestyle change: nothing identified  RD's Notes for Next Visit Assess adherence to pt chosen goals   MONITORING & EVALUATION Dietary intake, weekly physical activity, body weight.  Next Steps Patient is to follow-up in 4 months for 6 month post-op class.

## 2022-10-26 ENCOUNTER — Ambulatory Visit
Admission: RE | Admit: 2022-10-26 | Discharge: 2022-10-26 | Disposition: A | Discharge status: Home / Self Care | Payer: Federal, State, Local not specified - PPO | Source: Ambulatory Visit | Attending: Obstetrics and Gynecology | Admitting: Obstetrics and Gynecology

## 2022-10-26 DIAGNOSIS — Z1231 Encounter for screening mammogram for malignant neoplasm of breast: Secondary | ICD-10-CM | POA: Diagnosis not present

## 2022-12-17 DIAGNOSIS — E785 Hyperlipidemia, unspecified: Secondary | ICD-10-CM | POA: Diagnosis not present

## 2022-12-17 DIAGNOSIS — E1169 Type 2 diabetes mellitus with other specified complication: Secondary | ICD-10-CM | POA: Diagnosis not present

## 2022-12-22 DIAGNOSIS — I10 Essential (primary) hypertension: Secondary | ICD-10-CM | POA: Diagnosis not present

## 2022-12-22 DIAGNOSIS — E785 Hyperlipidemia, unspecified: Secondary | ICD-10-CM | POA: Diagnosis not present

## 2022-12-22 DIAGNOSIS — Z0000 Encounter for general adult medical examination without abnormal findings: Secondary | ICD-10-CM | POA: Diagnosis not present

## 2022-12-22 DIAGNOSIS — Z9884 Bariatric surgery status: Secondary | ICD-10-CM | POA: Diagnosis not present

## 2022-12-22 DIAGNOSIS — E1169 Type 2 diabetes mellitus with other specified complication: Secondary | ICD-10-CM | POA: Diagnosis not present

## 2023-02-22 ENCOUNTER — Encounter: Payer: Federal, State, Local not specified - PPO | Attending: Surgery | Admitting: Skilled Nursing Facility1

## 2023-02-22 DIAGNOSIS — Z6841 Body Mass Index (BMI) 40.0 and over, adult: Secondary | ICD-10-CM | POA: Diagnosis not present

## 2023-02-23 ENCOUNTER — Encounter: Payer: Self-pay | Admitting: Skilled Nursing Facility1

## 2023-02-23 NOTE — Progress Notes (Addendum)
Follow-up visit:  Post-Operative RYGB Surgery  Medical Nutrition Therapy:  Appt start time: 6:00pm end time:  7:00pm  Primary concerns today: Post-operative Bariatric Surgery Nutrition Management 6 Month Post-Op Class  Surgery date: 08/31/2022 Surgery type: RYGB  NUTRITION ASSESSMENT  Anthropometrics  Start weight at [facility]: 263 lbs (date: 02/24/2022)  Height: 66 in   Clinical  Medical hx: DM, HTN Medications: glimepiride, berberine, omega 3, vitamin B6, rybelsus, atorvastatin, amlodipine, bystolic   Labs: Z6X 10.3, glucose 266, creatinine .52 Notable signs/symptoms: N/A Any previous deficiencies? No Bowel Habits: Every day to every other day, some constipation, using MoM, switching to Murelax.    Body Composition Scale 09/14/2022 10/21/2022 02/23/2023  Current Body Weight 255.5 247.1 247.1  Total Body Fat % 45.8 45.0 45  Visceral Fat 17 16 16   Fat-Free Mass % 54.1 54.9 54.9   Total Body Water % 41.5 41.9 41.9  Muscle-Mass lbs 31.8 31.7 31.7  BMI 41.2 39.8 39.8  Body Fat Displacement            Torso  lbs 72.6 69.0 69         Left Leg  lbs 14.5 13.8 13.8         Right Leg  lbs 14.5 13.8 13.8         Left Arm  lbs 7.2 6.9 6.9         Right Arm   lbs 7.2 6.9 6.9     Information Reviewed/ Discussed During Appointment: -Review of composition scale numbers -Fluid requirements (64-100 ounces) -Protein requirements (60-80g) -Strategies for tolerating diet -Advancement of diet to include Starchy vegetables -Barriers to inclusion of new foods -Inclusion of appropriate multivitamin and calcium supplements  -Exercise recommendations   Fluid intake: adequate   Medications: See List Supplementation: appropriate    Using straws: no Drinking while eating: no Having you been chewing well: yes Chewing/swallowing difficulties: no Changes in vision: no Changes to mood/headaches: no Hair loss/[place] to skin/Changes to nails: no Any difficulty focusing or concentrating:  no Sweating: no Dizziness/Lightheaded: no Palpitations: no  Carbonated beverages: no N/V/D/C/GAS: no Abdominal Pain: no Dumping syndrome: no  Recent physical activity:  ADL's  Progress Towards Goal(s):  In Progress Teaching method utilized: Visual & Auditory  Demonstrated degree of understanding via: Teach Back  Readiness Level: Action Barriers to learning/adherence to lifestyle change: none identified  Handouts given during visit include: Phase V diet Progression  Goals Sheet The Benefits of Exercise are endless..... Support Group Topics   Teaching Method Utilized:  Visual Auditory Hands on  Demonstrated degree of understanding via:  Teach Back   Monitoring/Evaluation:  Dietary intake, exercise, and body weight. Follow up in 3 months for 9 month post-op visit.

## 2023-04-29 ENCOUNTER — Other Ambulatory Visit (HOSPITAL_COMMUNITY): Payer: Self-pay

## 2023-05-06 ENCOUNTER — Other Ambulatory Visit (HOSPITAL_COMMUNITY): Payer: Self-pay

## 2023-06-01 ENCOUNTER — Ambulatory Visit: Payer: Federal, State, Local not specified - PPO | Admitting: Skilled Nursing Facility1

## 2023-06-17 DIAGNOSIS — E1169 Type 2 diabetes mellitus with other specified complication: Secondary | ICD-10-CM | POA: Diagnosis not present

## 2023-06-28 DIAGNOSIS — E8889 Other specified metabolic disorders: Secondary | ICD-10-CM | POA: Diagnosis not present

## 2023-06-28 DIAGNOSIS — Z1331 Encounter for screening for depression: Secondary | ICD-10-CM | POA: Diagnosis not present

## 2023-06-28 DIAGNOSIS — Z9189 Other specified personal risk factors, not elsewhere classified: Secondary | ICD-10-CM | POA: Diagnosis not present

## 2023-06-28 DIAGNOSIS — I10 Essential (primary) hypertension: Secondary | ICD-10-CM | POA: Diagnosis not present

## 2023-06-28 DIAGNOSIS — Z9884 Bariatric surgery status: Secondary | ICD-10-CM | POA: Diagnosis not present

## 2023-06-28 DIAGNOSIS — E1169 Type 2 diabetes mellitus with other specified complication: Secondary | ICD-10-CM | POA: Diagnosis not present

## 2023-06-30 DIAGNOSIS — E119 Type 2 diabetes mellitus without complications: Secondary | ICD-10-CM | POA: Diagnosis not present

## 2023-06-30 DIAGNOSIS — E785 Hyperlipidemia, unspecified: Secondary | ICD-10-CM | POA: Diagnosis not present

## 2023-06-30 DIAGNOSIS — I10 Essential (primary) hypertension: Secondary | ICD-10-CM | POA: Diagnosis not present

## 2023-07-12 DIAGNOSIS — E1169 Type 2 diabetes mellitus with other specified complication: Secondary | ICD-10-CM | POA: Diagnosis not present

## 2023-07-12 DIAGNOSIS — I10 Essential (primary) hypertension: Secondary | ICD-10-CM | POA: Diagnosis not present

## 2023-07-12 DIAGNOSIS — E785 Hyperlipidemia, unspecified: Secondary | ICD-10-CM | POA: Diagnosis not present

## 2023-07-12 DIAGNOSIS — Z9189 Other specified personal risk factors, not elsewhere classified: Secondary | ICD-10-CM | POA: Diagnosis not present

## 2023-07-13 ENCOUNTER — Other Ambulatory Visit (HOSPITAL_COMMUNITY): Payer: Self-pay

## 2023-07-13 MED ORDER — MOUNJARO 2.5 MG/0.5ML ~~LOC~~ SOAJ
2.5000 mg | SUBCUTANEOUS | 0 refills | Status: AC
  Filled 2023-07-13: qty 2, 28d supply, fill #0

## 2023-07-27 ENCOUNTER — Encounter (HOSPITAL_COMMUNITY): Payer: Self-pay

## 2023-07-27 ENCOUNTER — Other Ambulatory Visit (HOSPITAL_COMMUNITY): Payer: Self-pay

## 2023-07-27 DIAGNOSIS — E785 Hyperlipidemia, unspecified: Secondary | ICD-10-CM | POA: Diagnosis not present

## 2023-07-27 DIAGNOSIS — Z9189 Other specified personal risk factors, not elsewhere classified: Secondary | ICD-10-CM | POA: Diagnosis not present

## 2023-07-27 DIAGNOSIS — E1169 Type 2 diabetes mellitus with other specified complication: Secondary | ICD-10-CM | POA: Diagnosis not present

## 2023-07-27 DIAGNOSIS — I10 Essential (primary) hypertension: Secondary | ICD-10-CM | POA: Diagnosis not present

## 2023-08-02 ENCOUNTER — Other Ambulatory Visit: Payer: Self-pay | Admitting: Family Medicine

## 2023-08-02 DIAGNOSIS — Z1231 Encounter for screening mammogram for malignant neoplasm of breast: Secondary | ICD-10-CM

## 2023-10-07 DIAGNOSIS — K648 Other hemorrhoids: Secondary | ICD-10-CM | POA: Diagnosis not present

## 2023-10-07 DIAGNOSIS — K635 Polyp of colon: Secondary | ICD-10-CM | POA: Diagnosis not present

## 2023-10-07 DIAGNOSIS — Z1211 Encounter for screening for malignant neoplasm of colon: Secondary | ICD-10-CM | POA: Diagnosis not present

## 2023-10-07 DIAGNOSIS — K5730 Diverticulosis of large intestine without perforation or abscess without bleeding: Secondary | ICD-10-CM | POA: Diagnosis not present

## 2023-10-24 DIAGNOSIS — N9489 Other specified conditions associated with female genital organs and menstrual cycle: Secondary | ICD-10-CM | POA: Diagnosis not present

## 2023-10-24 DIAGNOSIS — Z01419 Encounter for gynecological examination (general) (routine) without abnormal findings: Secondary | ICD-10-CM | POA: Diagnosis not present

## 2023-10-31 ENCOUNTER — Ambulatory Visit
Admission: RE | Admit: 2023-10-31 | Discharge: 2023-10-31 | Disposition: A | Discharge status: Home / Self Care | Payer: Federal, State, Local not specified - PPO | Source: Ambulatory Visit | Attending: Family Medicine | Admitting: Family Medicine

## 2023-10-31 DIAGNOSIS — Z1231 Encounter for screening mammogram for malignant neoplasm of breast: Secondary | ICD-10-CM

## 2024-01-02 DIAGNOSIS — Z0000 Encounter for general adult medical examination without abnormal findings: Secondary | ICD-10-CM | POA: Diagnosis not present

## 2024-01-02 DIAGNOSIS — I10 Essential (primary) hypertension: Secondary | ICD-10-CM | POA: Diagnosis not present

## 2024-01-02 DIAGNOSIS — E119 Type 2 diabetes mellitus without complications: Secondary | ICD-10-CM | POA: Diagnosis not present

## 2024-01-02 DIAGNOSIS — E785 Hyperlipidemia, unspecified: Secondary | ICD-10-CM | POA: Diagnosis not present

## 2024-01-31 DIAGNOSIS — E119 Type 2 diabetes mellitus without complications: Secondary | ICD-10-CM | POA: Diagnosis not present

## 2024-01-31 DIAGNOSIS — I10 Essential (primary) hypertension: Secondary | ICD-10-CM | POA: Diagnosis not present

## 2024-04-05 ENCOUNTER — Encounter (HOSPITAL_COMMUNITY): Payer: Self-pay | Admitting: *Deleted

## 2024-08-09 DIAGNOSIS — E119 Type 2 diabetes mellitus without complications: Secondary | ICD-10-CM | POA: Diagnosis not present

## 2024-08-09 DIAGNOSIS — I10 Essential (primary) hypertension: Secondary | ICD-10-CM | POA: Diagnosis not present

## 2024-08-17 ENCOUNTER — Other Ambulatory Visit: Payer: Self-pay | Admitting: Family Medicine

## 2024-08-17 DIAGNOSIS — Z1231 Encounter for screening mammogram for malignant neoplasm of breast: Secondary | ICD-10-CM

## 2024-10-18 DIAGNOSIS — K080 Exfoliation of teeth due to systemic causes: Secondary | ICD-10-CM | POA: Diagnosis not present

## 2024-10-26 DIAGNOSIS — E119 Type 2 diabetes mellitus without complications: Secondary | ICD-10-CM | POA: Diagnosis not present

## 2024-10-31 ENCOUNTER — Inpatient Hospital Stay: Admission: RE | Admit: 2024-10-31 | Discharge: 2024-10-31 | Attending: Family Medicine | Admitting: Family Medicine

## 2024-10-31 DIAGNOSIS — Z1231 Encounter for screening mammogram for malignant neoplasm of breast: Secondary | ICD-10-CM
